# Patient Record
Sex: Male | Born: 1960
Health system: Southern US, Community
[De-identification: ages and names within clinical notes are randomized; demographics above are authoritative.]

## PROBLEM LIST (undated history)

## (undated) DIAGNOSIS — I10 Essential (primary) hypertension: Secondary | ICD-10-CM

## (undated) DIAGNOSIS — F329 Major depressive disorder, single episode, unspecified: Secondary | ICD-10-CM

## (undated) DIAGNOSIS — F1010 Alcohol abuse, uncomplicated: Secondary | ICD-10-CM

## (undated) DIAGNOSIS — K219 Gastro-esophageal reflux disease without esophagitis: Secondary | ICD-10-CM

## (undated) DIAGNOSIS — F32A Depression, unspecified: Secondary | ICD-10-CM

## (undated) HISTORY — DX: Gastro-esophageal reflux disease without esophagitis: K21.9

## (undated) HISTORY — DX: Depression, unspecified: F32.A

## (undated) HISTORY — DX: Major depressive disorder, single episode, unspecified: F32.9

## (undated) HISTORY — DX: Alcohol abuse, uncomplicated: F10.10

## (undated) HISTORY — DX: Essential (primary) hypertension: I10

---

## 1988-03-21 HISTORY — PX: MIDDLE EAR SURGERY: SHX713

## 1997-07-23 ENCOUNTER — Ambulatory Visit (HOSPITAL_BASED_OUTPATIENT_CLINIC_OR_DEPARTMENT_OTHER): Admission: RE | Admit: 1997-07-23 | Discharge: 1997-07-23 | Payer: Self-pay | Admitting: *Deleted

## 1999-06-14 ENCOUNTER — Encounter (INDEPENDENT_AMBULATORY_CARE_PROVIDER_SITE_OTHER): Payer: Self-pay

## 1999-06-14 ENCOUNTER — Encounter: Payer: Self-pay | Admitting: Urology

## 1999-06-14 ENCOUNTER — Ambulatory Visit (HOSPITAL_COMMUNITY): Admission: RE | Admit: 1999-06-14 | Discharge: 1999-06-14 | Payer: Self-pay | Admitting: Urology

## 2000-06-24 ENCOUNTER — Emergency Department (HOSPITAL_COMMUNITY): Admission: EM | Admit: 2000-06-24 | Discharge: 2000-06-24 | Payer: Self-pay | Admitting: Emergency Medicine

## 2001-12-04 ENCOUNTER — Ambulatory Visit (HOSPITAL_BASED_OUTPATIENT_CLINIC_OR_DEPARTMENT_OTHER): Admission: RE | Admit: 2001-12-04 | Discharge: 2001-12-04 | Payer: Self-pay | Admitting: Orthopedic Surgery

## 2001-12-04 ENCOUNTER — Encounter: Payer: Self-pay | Admitting: Orthopedic Surgery

## 2002-04-07 ENCOUNTER — Emergency Department (HOSPITAL_COMMUNITY): Admission: EM | Admit: 2002-04-07 | Discharge: 2002-04-07 | Payer: Self-pay | Admitting: Emergency Medicine

## 2002-05-15 ENCOUNTER — Emergency Department (HOSPITAL_COMMUNITY): Admission: EM | Admit: 2002-05-15 | Discharge: 2002-05-15 | Payer: Self-pay

## 2002-11-07 ENCOUNTER — Emergency Department (HOSPITAL_COMMUNITY): Admission: AD | Admit: 2002-11-07 | Discharge: 2002-11-07 | Payer: Self-pay | Admitting: Emergency Medicine

## 2004-07-21 ENCOUNTER — Ambulatory Visit: Payer: Self-pay | Admitting: *Deleted

## 2004-07-21 ENCOUNTER — Ambulatory Visit: Payer: Self-pay | Admitting: Nurse Practitioner

## 2004-08-05 ENCOUNTER — Ambulatory Visit: Payer: Self-pay | Admitting: Nurse Practitioner

## 2004-10-08 ENCOUNTER — Ambulatory Visit: Payer: Self-pay | Admitting: Nurse Practitioner

## 2004-10-15 ENCOUNTER — Ambulatory Visit: Payer: Self-pay | Admitting: Nurse Practitioner

## 2005-01-21 ENCOUNTER — Ambulatory Visit: Payer: Self-pay | Admitting: Nurse Practitioner

## 2005-05-11 ENCOUNTER — Ambulatory Visit: Payer: Self-pay | Admitting: Nurse Practitioner

## 2005-06-02 ENCOUNTER — Ambulatory Visit: Payer: Self-pay | Admitting: Nurse Practitioner

## 2005-07-15 ENCOUNTER — Ambulatory Visit: Payer: Self-pay | Admitting: Nurse Practitioner

## 2005-08-22 ENCOUNTER — Ambulatory Visit: Payer: Self-pay | Admitting: Nurse Practitioner

## 2005-10-18 ENCOUNTER — Ambulatory Visit: Payer: Self-pay | Admitting: Nurse Practitioner

## 2005-12-06 ENCOUNTER — Ambulatory Visit: Payer: Self-pay | Admitting: Nurse Practitioner

## 2006-01-02 ENCOUNTER — Ambulatory Visit: Payer: Self-pay | Admitting: Nurse Practitioner

## 2006-01-02 ENCOUNTER — Emergency Department (HOSPITAL_COMMUNITY): Admission: EM | Admit: 2006-01-02 | Discharge: 2006-01-02 | Payer: Self-pay | Admitting: Emergency Medicine

## 2006-02-24 ENCOUNTER — Ambulatory Visit: Payer: Self-pay | Admitting: Nurse Practitioner

## 2006-09-04 ENCOUNTER — Ambulatory Visit (HOSPITAL_COMMUNITY): Admission: RE | Admit: 2006-09-04 | Discharge: 2006-09-04 | Payer: Self-pay | Admitting: Internal Medicine

## 2006-09-04 ENCOUNTER — Ambulatory Visit: Payer: Self-pay | Admitting: Internal Medicine

## 2006-09-10 ENCOUNTER — Emergency Department (HOSPITAL_COMMUNITY): Admission: EM | Admit: 2006-09-10 | Discharge: 2006-09-10 | Payer: Self-pay | Admitting: Emergency Medicine

## 2006-10-09 DIAGNOSIS — F22 Delusional disorders: Secondary | ICD-10-CM | POA: Insufficient documentation

## 2006-10-09 DIAGNOSIS — E785 Hyperlipidemia, unspecified: Secondary | ICD-10-CM

## 2006-10-09 DIAGNOSIS — F3130 Bipolar disorder, current episode depressed, mild or moderate severity, unspecified: Secondary | ICD-10-CM

## 2006-10-09 DIAGNOSIS — I10 Essential (primary) hypertension: Secondary | ICD-10-CM

## 2006-10-09 DIAGNOSIS — F4310 Post-traumatic stress disorder, unspecified: Secondary | ICD-10-CM | POA: Insufficient documentation

## 2006-12-06 ENCOUNTER — Encounter (INDEPENDENT_AMBULATORY_CARE_PROVIDER_SITE_OTHER): Payer: Self-pay | Admitting: *Deleted

## 2010-08-06 NOTE — Op Note (Signed)
NAME:  Gabriel Miller, Gabriel Miller                            ACCOUNT NO.:  [id number]   MEDICAL RECORD NO.:  [id number]                   PATIENT TYPE:  AMB   LOCATION:  DSC                                  FACILITY:  [facility]   PHYSICIAN:  Katy Fitch. Naaman Plummer., M.D.          DATE OF BIRTH:  1960-11-11   DATE OF PROCEDURE:  12/04/2001  DATE OF DISCHARGE:                                 OPERATIVE REPORT   PREOPERATIVE DIAGNOSIS:  Extremely untidy ring avulsion amputation of left  ring finger with skin avulsion at level of diaphysis of proximal phalanx and  bone avulsion through diaphysis of middle phalanx.   POSTOPERATIVE DIAGNOSIS:  Extremely untidy ring avulsion amputation of left  ring finger with skin avulsion at level of diaphysis of proximal phalanx and  bone avulsion through diaphysis of middle phalanx.   OPERATIONS:  1. Extensive debridement of skin, subcutaneous tissue, tendon and     neurovascular structures due to severely soiled ring avulsion amputation,     left ring finger.  2. Revision amputation at diaphyseal level of proximal phalanx with a     partial primary closure of wound.   OPERATING SURGEON:  Katy Fitch. Sypher, M.D.   ASSISTANT:  Jonni Sanger, P.A.   ANESTHESIA:  Axillary block.   SUPERVISING ANESTHESIOLOGIST:  Janetta Hora. Gelene Mink, M.D.   INDICATIONS:  The patient is a 50 year old man who, earlier today, got his  left ring finger caught in the mechanism of a dump truck.  He sustained a  complex avulsion of his left ring finger with an osseous fracture amputation  through the middle phalanx and a skin avulsion at the web space adjacent to  the long and small fingers.  His wound was profoundly soiled with clay and  other debris.   He was brought by EMS to the emergency room where he was evaluated by Dr.  Beverely Pace.   Screening labs were obtained which revealed a hemoglobin of 14.7 grams  percent.  His coagulation studies were normal and his electrolytes were  noted to be normal except for an elevated glucose of 121.  This is a random  glucose.   A hand surgery consult was requested.  At the time of his evaluation, he was  noted to have a very untidy wound.  Arrangements were made for admission to  the hospital for revision amputation.  Preoperatively we had a lengthy  discussion with the patient and his family, recommending that we debride his  wound at this time and once a stable wound is obtained, we anticipate  recommending a ray amputation with transfer of the small finger metacarpal  towards the long finger metacarpal to close the hole in his hand.   Questions were invited and answered with the patient, his wife and son.   PROCEDURE:  The patient is brought to the operating room and placed in the  supine position on the operating  table.   An axillary block placed by Dr.  Gelene Mink in the holding area led to  satisfactory anesthesia of the left arm.  The arm was then prepped with  Betadine soap and solution and sterilely draped.   The procedure commenced with exsanguination of the limb with an Esmarch  bandage and placement of an arterial tourniquet to 270 mmHg due to mild  systolic hypertension.   The procedure commenced with an orderly debridement beginning with a sterile  sponge and Betadine and sterile saline with thoroughly scrubbing to remove  clay that was ground into the soft tissues.   Once the wound was thoroughly lavaged with about one liter of saline, a  sharp debridement of skin, subcutaneous tissue, devitalized tendon and  devitalized neurovascular structures was accomplished.   An orderly revision amputation was then accomplished at the level of the  diaphysis of the proximal phalanx with traction being placed on the flexor  tendons and transection of the superficialis profundus tendons followed by  traction being placed on the neurovascular structures with the nerves being  sharply transected with a fresh scalpel  blade and the arteries being  electrocauterized with the bipolar cautery.  The skin was sharply debrided  circumferentially.  A long palmar flap was created which was used to close  at the level of the web space with a total of three mattressed sutures in  the central portion of the wound.  The radial and ulnar aspects of the wound  were left open to allow egress of hematoma and other seropurulent fluids.   This wound was very untidy, extremely soiled and will be allowed to close by  secondary intension on both sides.   The tourniquet was released and hemostasis was noted to be satisfactory.  A  voluminous gauze dressing was applied with Ace wrap.   There were no apparent complications.   The patient tolerated the surgery and anesthesia well.  He was transferred  to the recovery room with stable vital signs.   He will be discharged with a prescription for Dilaudid 2 mg one or two  tablets p.o. q.4-6h. p.r.n. pain, 30 tablets, without refill.  Also Keflex  500 mg one p.o. q.8h. times four days for prophylactic antibiotic and Motrin  600 mg one p.o. q.6h. p.r.n. pain, 30 tablets without refill.                                               Katy Fitch Naaman Plummer., M.D.    RVS/MEDQ  D:  12/04/2001  T:  12/05/2001  Job:  [id number]   cc:   Katy Fitch. Naaman Plummer., M.D.

## 2010-11-26 ENCOUNTER — Emergency Department (HOSPITAL_COMMUNITY): Payer: Medicare Other

## 2010-11-26 ENCOUNTER — Emergency Department (HOSPITAL_COMMUNITY)
Admission: EM | Admit: 2010-11-26 | Discharge: 2010-11-26 | Disposition: A | Discharge status: Home / Self Care | Payer: Medicare Other | Attending: Emergency Medicine | Admitting: Emergency Medicine

## 2010-11-26 DIAGNOSIS — F319 Bipolar disorder, unspecified: Secondary | ICD-10-CM | POA: Insufficient documentation

## 2010-11-26 DIAGNOSIS — E785 Hyperlipidemia, unspecified: Secondary | ICD-10-CM | POA: Insufficient documentation

## 2010-11-26 DIAGNOSIS — S0510XA Contusion of eyeball and orbital tissues, unspecified eye, initial encounter: Secondary | ICD-10-CM | POA: Insufficient documentation

## 2010-11-26 DIAGNOSIS — S0280XA Fracture of other specified skull and facial bones, unspecified side, initial encounter for closed fracture: Secondary | ICD-10-CM | POA: Insufficient documentation

## 2010-11-26 DIAGNOSIS — R220 Localized swelling, mass and lump, head: Secondary | ICD-10-CM | POA: Insufficient documentation

## 2010-11-26 DIAGNOSIS — H1130 Conjunctival hemorrhage, unspecified eye: Secondary | ICD-10-CM | POA: Insufficient documentation

## 2010-11-26 DIAGNOSIS — F17200 Nicotine dependence, unspecified, uncomplicated: Secondary | ICD-10-CM | POA: Insufficient documentation

## 2010-11-26 DIAGNOSIS — Z79899 Other long term (current) drug therapy: Secondary | ICD-10-CM | POA: Insufficient documentation

## 2010-11-26 DIAGNOSIS — I10 Essential (primary) hypertension: Secondary | ICD-10-CM | POA: Insufficient documentation

## 2010-11-26 DIAGNOSIS — R51 Headache: Secondary | ICD-10-CM | POA: Insufficient documentation

## 2010-11-26 DIAGNOSIS — R221 Localized swelling, mass and lump, neck: Secondary | ICD-10-CM | POA: Insufficient documentation

## 2010-11-26 DIAGNOSIS — R079 Chest pain, unspecified: Secondary | ICD-10-CM | POA: Insufficient documentation

## 2010-11-26 DIAGNOSIS — S0230XA Fracture of orbital floor, unspecified side, initial encounter for closed fracture: Secondary | ICD-10-CM | POA: Insufficient documentation

## 2011-01-05 LAB — CBC
HCT: 44.2
Hemoglobin: 14.7
MCHC: 33.3
MCV: 80.7
Platelets: 274
RBC: 5.47
RDW: 13.7
WBC: 6.8

## 2011-01-05 LAB — D-DIMER, QUANTITATIVE: D-Dimer, Quant: <0.22

## 2011-01-05 LAB — DIFFERENTIAL
Basophils Absolute: 0
Basophils Relative: 1
Eosinophils Absolute: 0.7
Eosinophils Relative: 10 — ABNORMAL HIGH
Lymphocytes Relative: 25
Lymphs Abs: 1.7
Monocytes Absolute: 0.7
Monocytes Relative: 11
Neutro Abs: 3.6
Neutrophils Relative %: 54

## 2011-01-05 LAB — BASIC METABOLIC PANEL WITH GFR
BUN: 13
CO2: 25
Calcium: 9
Creatinine, Ser: 1.56 — ABNORMAL HIGH
GFR calc Af Amer: 59 — ABNORMAL LOW
Glucose, Bld: 107 — ABNORMAL HIGH

## 2011-01-05 LAB — BASIC METABOLIC PANEL
Chloride: 102
GFR calc non Af Amer: 48 — ABNORMAL LOW
Potassium: 3.8
Sodium: 135

## 2011-01-05 LAB — POCT CARDIAC MARKERS
CKMB, poc: <1 — ABNORMAL LOW
Troponin i, poc: <0.05

## 2011-01-27 ENCOUNTER — Encounter: Payer: Self-pay | Admitting: Family Medicine

## 2011-01-27 ENCOUNTER — Ambulatory Visit (INDEPENDENT_AMBULATORY_CARE_PROVIDER_SITE_OTHER): Payer: Medicare Other | Admitting: Family Medicine

## 2011-01-27 VITALS — BP 118/88 | HR 80 | Temp 98.5°F | Resp 12 | Ht 69.0 in | Wt 204.0 lb

## 2011-01-27 DIAGNOSIS — Z23 Encounter for immunization: Secondary | ICD-10-CM

## 2011-01-27 DIAGNOSIS — F4310 Post-traumatic stress disorder, unspecified: Secondary | ICD-10-CM

## 2011-01-27 DIAGNOSIS — F3130 Bipolar disorder, current episode depressed, mild or moderate severity, unspecified: Secondary | ICD-10-CM

## 2011-01-27 DIAGNOSIS — I10 Essential (primary) hypertension: Secondary | ICD-10-CM

## 2011-01-27 NOTE — Patient Instructions (Signed)
Consider complete physical at some point early next year.
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
## 2011-05-20 ENCOUNTER — Other Ambulatory Visit: Payer: Medicare Other

## 2011-05-27 ENCOUNTER — Encounter: Payer: Medicare Other | Admitting: Family Medicine

## 2011-09-04 ENCOUNTER — Encounter (HOSPITAL_COMMUNITY): Payer: Self-pay | Admitting: *Deleted

## 2011-09-04 ENCOUNTER — Inpatient Hospital Stay (HOSPITAL_COMMUNITY)
Admission: EM | Admit: 2011-09-04 | Discharge: 2011-09-07 | DRG: 189 | Disposition: A | Discharge status: Home / Self Care | Payer: Medicare Other | Source: Ambulatory Visit | Attending: Internal Medicine | Admitting: Internal Medicine

## 2011-09-04 ENCOUNTER — Emergency Department (HOSPITAL_COMMUNITY): Payer: Medicare Other

## 2011-09-04 DIAGNOSIS — E871 Hypo-osmolality and hyponatremia: Secondary | ICD-10-CM

## 2011-09-04 DIAGNOSIS — I10 Essential (primary) hypertension: Secondary | ICD-10-CM

## 2011-09-04 DIAGNOSIS — F3130 Bipolar disorder, current episode depressed, mild or moderate severity, unspecified: Secondary | ICD-10-CM | POA: Diagnosis present

## 2011-09-04 DIAGNOSIS — J40 Bronchitis, not specified as acute or chronic: Secondary | ICD-10-CM | POA: Diagnosis present

## 2011-09-04 DIAGNOSIS — R55 Syncope and collapse: Secondary | ICD-10-CM | POA: Diagnosis present

## 2011-09-04 DIAGNOSIS — F4310 Post-traumatic stress disorder, unspecified: Secondary | ICD-10-CM

## 2011-09-04 DIAGNOSIS — I129 Hypertensive chronic kidney disease with stage 1 through stage 4 chronic kidney disease, or unspecified chronic kidney disease: Secondary | ICD-10-CM | POA: Diagnosis present

## 2011-09-04 DIAGNOSIS — R0902 Hypoxemia: Secondary | ICD-10-CM

## 2011-09-04 DIAGNOSIS — F17200 Nicotine dependence, unspecified, uncomplicated: Secondary | ICD-10-CM | POA: Diagnosis present

## 2011-09-04 DIAGNOSIS — F22 Delusional disorders: Secondary | ICD-10-CM

## 2011-09-04 DIAGNOSIS — J9600 Acute respiratory failure, unspecified whether with hypoxia or hypercapnia: Secondary | ICD-10-CM

## 2011-09-04 DIAGNOSIS — J441 Chronic obstructive pulmonary disease with (acute) exacerbation: Secondary | ICD-10-CM | POA: Diagnosis present

## 2011-09-04 DIAGNOSIS — E876 Hypokalemia: Secondary | ICD-10-CM | POA: Diagnosis present

## 2011-09-04 DIAGNOSIS — J9601 Acute respiratory failure with hypoxia: Secondary | ICD-10-CM

## 2011-09-04 DIAGNOSIS — E785 Hyperlipidemia, unspecified: Secondary | ICD-10-CM | POA: Diagnosis present

## 2011-09-04 DIAGNOSIS — N182 Chronic kidney disease, stage 2 (mild): Secondary | ICD-10-CM | POA: Diagnosis present

## 2011-09-04 DIAGNOSIS — R05 Cough: Secondary | ICD-10-CM | POA: Diagnosis present

## 2011-09-04 DIAGNOSIS — Z8249 Family history of ischemic heart disease and other diseases of the circulatory system: Secondary | ICD-10-CM

## 2011-09-04 LAB — CBC
Hemoglobin: 14.8 g/dL (ref 13.0–17.0)
MCHC: 34.3 g/dL (ref 30.0–36.0)
RBC: 4.71 MIL/uL (ref 4.22–5.81)
WBC: 9.4 10*3/uL (ref 4.0–10.5)

## 2011-09-04 LAB — CARDIAC PANEL(CRET KIN+CKTOT+MB+TROPI)
CK, MB: 1.9 ng/mL (ref 0.3–4.0)
Troponin I: <0.3 ng/mL (ref <0.30)

## 2011-09-04 LAB — BASIC METABOLIC PANEL
BUN: 15 mg/dL (ref 6–23)
Chloride: 100 mEq/L (ref 96–112)
GFR calc Af Amer: 70 mL/min — ABNORMAL LOW (ref >90)
Potassium: 3.2 mEq/L — ABNORMAL LOW (ref 3.5–5.1)

## 2011-09-04 LAB — DIFFERENTIAL
Basophils Relative: 0 % (ref 0–1)
Lymphs Abs: 1.4 10*3/uL (ref 0.7–4.0)
Monocytes Relative: 7 % (ref 3–12)
Neutro Abs: 7 10*3/uL (ref 1.7–7.7)
Neutrophils Relative %: 75 % (ref 43–77)

## 2011-09-04 MED ORDER — GABAPENTIN 400 MG PO CAPS
1200.0000 mg | ORAL_CAPSULE | Freq: Every day | ORAL | Status: DC
  Administered 2011-09-04 – 2011-09-06 (×3): 1200 mg via ORAL
  Filled 2011-09-04 (×4): qty 3

## 2011-09-04 MED ORDER — IPRATROPIUM BROMIDE 0.02 % IN SOLN
0.5000 mg | Freq: Once | RESPIRATORY_TRACT | Status: AC
  Administered 2011-09-04: 0.5 mg via RESPIRATORY_TRACT
  Filled 2011-09-04: qty 2.5

## 2011-09-04 MED ORDER — ACETAMINOPHEN 650 MG RE SUPP: 650.0000 mg | Freq: Four times a day (QID) | RECTAL | Status: DC | PRN

## 2011-09-04 MED ORDER — ALBUTEROL SULFATE (5 MG/ML) 0.5% IN NEBU: 5.0000 mg | INHALATION_SOLUTION | RESPIRATORY_TRACT | Status: AC | PRN

## 2011-09-04 MED ORDER — AZITHROMYCIN 500 MG PO TABS
500.0000 mg | ORAL_TABLET | Freq: Every day | ORAL | Status: DC
  Administered 2011-09-04: 500 mg via ORAL
  Filled 2011-09-04: qty 1
  Filled 2011-09-04: qty 2

## 2011-09-04 MED ORDER — SODIUM CHLORIDE 0.9 % IV SOLN
INTRAVENOUS | Status: DC
  Administered 2011-09-04: 20:00:00 via INTRAVENOUS

## 2011-09-04 MED ORDER — GUAIFENESIN ER 600 MG PO TB12
600.0000 mg | ORAL_TABLET | Freq: Two times a day (BID) | ORAL | Status: DC
  Administered 2011-09-04 – 2011-09-07 (×6): 600 mg via ORAL
  Filled 2011-09-04 (×7): qty 1

## 2011-09-04 MED ORDER — ONDANSETRON HCL 4 MG PO TABS: 4.0000 mg | ORAL_TABLET | Freq: Four times a day (QID) | ORAL | Status: DC | PRN

## 2011-09-04 MED ORDER — VITAMIN B-1 100 MG PO TABS
100.0000 mg | ORAL_TABLET | Freq: Every day | ORAL | Status: DC
  Administered 2011-09-04 – 2011-09-07 (×4): 100 mg via ORAL
  Filled 2011-09-04 (×4): qty 1

## 2011-09-04 MED ORDER — TRAZODONE HCL 50 MG PO TABS
50.0000 mg | ORAL_TABLET | Freq: Every evening | ORAL | Status: DC | PRN
  Administered 2011-09-05: 50 mg via ORAL
  Filled 2011-09-04: qty 1

## 2011-09-04 MED ORDER — FOLIC ACID 1 MG PO TABS
1.0000 mg | ORAL_TABLET | Freq: Every day | ORAL | Status: DC
  Administered 2011-09-04 – 2011-09-07 (×4): 1 mg via ORAL
  Filled 2011-09-04 (×4): qty 1

## 2011-09-04 MED ORDER — NICOTINE 14 MG/24HR TD PT24
14.0000 mg | MEDICATED_PATCH | Freq: Every day | TRANSDERMAL | Status: DC
  Administered 2011-09-04 – 2011-09-07 (×4): 14 mg via TRANSDERMAL
  Filled 2011-09-04 (×4): qty 1

## 2011-09-04 MED ORDER — IPRATROPIUM BROMIDE 0.02 % IN SOLN
0.5000 mg | Freq: Four times a day (QID) | RESPIRATORY_TRACT | Status: DC
  Administered 2011-09-04 – 2011-09-07 (×11): 0.5 mg via RESPIRATORY_TRACT
  Filled 2011-09-04 (×11): qty 2.5

## 2011-09-04 MED ORDER — HALOPERIDOL 5 MG PO TABS
5.0000 mg | ORAL_TABLET | Freq: Every evening | ORAL | Status: DC | PRN
  Administered 2011-09-06: 5 mg via ORAL
  Filled 2011-09-04 (×2): qty 1

## 2011-09-04 MED ORDER — POTASSIUM CHLORIDE CRYS ER 20 MEQ PO TBCR
40.0000 meq | EXTENDED_RELEASE_TABLET | Freq: Once | ORAL | Status: AC
  Administered 2011-09-04: 40 meq via ORAL
  Filled 2011-09-04: qty 2

## 2011-09-04 MED ORDER — GABAPENTIN 400 MG PO CAPS
400.0000 mg | ORAL_CAPSULE | Freq: Three times a day (TID) | ORAL | Status: DC
  Filled 2011-09-04: qty 3

## 2011-09-04 MED ORDER — GUAIFENESIN-DM 100-10 MG/5ML PO SYRP
5.0000 mL | ORAL_SOLUTION | ORAL | Status: DC | PRN
  Administered 2011-09-04 – 2011-09-07 (×4): 5 mL via ORAL
  Filled 2011-09-04 (×6): qty 5

## 2011-09-04 MED ORDER — ENOXAPARIN SODIUM 40 MG/0.4ML ~~LOC~~ SOLN
40.0000 mg | SUBCUTANEOUS | Status: DC
  Administered 2011-09-04 – 2011-09-06 (×3): 40 mg via SUBCUTANEOUS
  Filled 2011-09-04 (×4): qty 0.4

## 2011-09-04 MED ORDER — ACETAMINOPHEN 325 MG PO TABS
650.0000 mg | ORAL_TABLET | Freq: Four times a day (QID) | ORAL | Status: DC | PRN
  Administered 2011-09-06: 650 mg via ORAL
  Filled 2011-09-04: qty 2

## 2011-09-04 MED ORDER — ALBUTEROL SULFATE (5 MG/ML) 0.5% IN NEBU
5.0000 mg | INHALATION_SOLUTION | Freq: Once | RESPIRATORY_TRACT | Status: AC
  Administered 2011-09-04: 5 mg via RESPIRATORY_TRACT
  Filled 2011-09-04: qty 1

## 2011-09-04 MED ORDER — GABAPENTIN 400 MG PO CAPS
400.0000 mg | ORAL_CAPSULE | Freq: Two times a day (BID) | ORAL | Status: DC
  Administered 2011-09-05 – 2011-09-07 (×5): 400 mg via ORAL
  Filled 2011-09-04 (×6): qty 1

## 2011-09-04 MED ORDER — ALBUTEROL SULFATE (5 MG/ML) 0.5% IN NEBU: 2.5000 mg | INHALATION_SOLUTION | RESPIRATORY_TRACT | Status: DC | PRN

## 2011-09-04 MED ORDER — ONDANSETRON HCL 4 MG/2ML IJ SOLN: 4.0000 mg | Freq: Four times a day (QID) | INTRAMUSCULAR | Status: DC | PRN

## 2011-09-04 MED ORDER — ZOLPIDEM TARTRATE 5 MG PO TABS
5.0000 mg | ORAL_TABLET | Freq: Every evening | ORAL | Status: DC | PRN
  Administered 2011-09-04: 5 mg via ORAL
  Filled 2011-09-04: qty 1

## 2011-09-04 MED ORDER — METHYLPREDNISOLONE SODIUM SUCC 40 MG IJ SOLR
40.0000 mg | Freq: Four times a day (QID) | INTRAMUSCULAR | Status: DC
  Administered 2011-09-04 – 2011-09-05 (×3): 40 mg via INTRAVENOUS
  Filled 2011-09-04 (×7): qty 1

## 2011-09-04 MED ORDER — CEFTRIAXONE SODIUM 1 G IJ SOLR
1.0000 g | INTRAMUSCULAR | Status: DC
  Administered 2011-09-04: 1 g via INTRAVENOUS
  Filled 2011-09-04 (×2): qty 10

## 2011-09-04 MED ORDER — ALBUTEROL SULFATE (5 MG/ML) 0.5% IN NEBU
2.5000 mg | INHALATION_SOLUTION | Freq: Four times a day (QID) | RESPIRATORY_TRACT | Status: DC
  Administered 2011-09-04 – 2011-09-07 (×11): 2.5 mg via RESPIRATORY_TRACT
  Filled 2011-09-04 (×11): qty 0.5

## 2011-09-04 MED ORDER — FLUOXETINE HCL 20 MG PO CAPS
60.0000 mg | ORAL_CAPSULE | Freq: Every morning | ORAL | Status: DC
  Administered 2011-09-05 – 2011-09-07 (×3): 60 mg via ORAL
  Filled 2011-09-04 (×3): qty 3

## 2011-09-04 NOTE — ED Provider Notes (Signed)
History     CSN: [id number]  Arrival date & time 09/04/11  1105   First MD Initiated Contact with Patient 09/04/11 1227      Chief Complaint  Patient presents with  . Shortness of Breath  . Cough  . Dizziness    (Consider location/radiation/quality/duration/timing/severity/associated sxs/prior treatment) Patient is a 51 y.o. male presenting with shortness of breath and cough. The history is provided by the patient.  Shortness of Breath  The current episode started 2 days ago. Associated symptoms include a fever, cough, shortness of breath and wheezing. Pertinent negatives include no chest pain.  Cough Associated symptoms include shortness of breath and wheezing. Pertinent negatives include no chest pain and no headaches.   and patient's had nausea the patient had cough is productive since Friday. Some shortness breath and dizziness. He is a smoker but does not have a clear diagnosis of COPD. He still feels weak all over. No nausea vomiting diarrhea. No chest pain. No leg swelling. No recent travel.  Past Medical History  Diagnosis Date  . Alcohol abuse   . Depression   . GERD (gastroesophageal reflux disease)   . Hypertension     Past Surgical History  Procedure Date  . Middle ear surgery 1990    Family History  Problem Relation Age of Onset  . Arthritis Mother   . Heart disease Mother   . Hypertension Mother   . Diabetes Mother   . COPD Mother   . Alcohol abuse Father   . Hypertension Father   . Diabetes Father   . Heart attack Father   . Hypertension Maternal Aunt   . Cancer Maternal Aunt     breast  . Cancer Maternal Grandmother     breast  . Diabetes Maternal Grandmother   . Diabetes Maternal Grandfather   . Diabetes Paternal Grandfather     History  Substance Use Topics  . Smoking status: Current Everyday Smoker -- 11.0 packs/day for 20 years    Types: Cigarettes  . Smokeless tobacco: Not on file  . Alcohol Use: Yes      Review of Systems    Constitutional: Positive for fever and fatigue. Negative for activity change and appetite change.  HENT: Negative for neck stiffness.   Eyes: Negative for pain.  Respiratory: Positive for cough, shortness of breath and wheezing. Negative for chest tightness.   Cardiovascular: Negative for chest pain and leg swelling.  Gastrointestinal: Negative for nausea, vomiting, abdominal pain and diarrhea.  Genitourinary: Negative for flank pain.  Musculoskeletal: Negative for back pain.  Skin: Negative for rash.  Neurological: Negative for weakness, numbness and headaches.  Psychiatric/Behavioral: Negative for behavioral problems.    Allergies  Review of patient's allergies indicates no known allergies.  Home Medications   Current Outpatient Rx  Name Route Sig Dispense Refill  . FLUOXETINE HCL 20 MG PO CAPS Oral Take 60 mg by mouth every morning.     Marland Kitchen GABAPENTIN 400 MG PO CAPS Oral Take 400-1,200 mg by mouth 3 (three) times daily. Take 400 MG twice a day and take 1200 MG at bedtime.    Marland Kitchen HALOPERIDOL 5 MG PO TABS Oral Take 5 mg by mouth at bedtime.     . TRAZODONE HCL 50 MG PO TABS Oral Take 50 mg by mouth at bedtime as needed. For sleep.      BP 139/91  Pulse 93  Temp 97.3 F (36.3 C) (Oral)  Resp 20  SpO2 90%  Physical Exam  Nursing  note and vitals reviewed. Constitutional: He is oriented to person, place, and time. He appears well-developed and well-nourished.  HENT:  Head: Normocephalic and atraumatic.  Eyes: EOM are normal. Pupils are equal, round, and reactive to light.  Neck: Normal range of motion. Neck supple.  Cardiovascular: Normal rate, regular rhythm and normal heart sounds.   No murmur heard. Pulmonary/Chest:       Diffuse harsh breath sounds and prolonged expirations. Diffuse wheezes. Mild respiratory distress.  Abdominal: Soft. Bowel sounds are normal. He exhibits no distension and no mass. There is no tenderness. There is no rebound and no guarding.   Musculoskeletal: Normal range of motion. He exhibits no edema.  Neurological: He is alert and oriented to person, place, and time. No cranial nerve deficit.  Skin: Skin is warm and dry.  Psychiatric: He has a normal mood and affect.    ED Course  Procedures (including critical care time)  Labs Reviewed  BASIC METABOLIC PANEL - Abnormal; Notable for the following:    Potassium 3.2 (*)     Glucose, Bld 110 (*)     GFR calc non Af Amer 60 (*)     GFR calc Af Amer 70 (*)     All other components within normal limits  CBC  DIFFERENTIAL  PRO B NATRIURETIC PEPTIDE  TROPONIN I  CARDIAC PANEL(CRET KIN+CKTOT+MB+TROPI)  CARDIAC PANEL(CRET KIN+CKTOT+MB+TROPI)   Dg Chest 2 View  09/04/2011  *RADIOLOGY REPORT*  Clinical Data: Shortness of breath, cough and dizziness.  CHEST - 2 VIEW  Comparison: Chest x-ray 11/26/2010.  Findings: There is mild diffuse interstitial prominence and peribronchial cuffing.  No acute consolidative airspace disease. No pleural effusions.  Pulmonary vasculature and the cardiomediastinal silhouette are within normal limits.  IMPRESSION: 1.  Mild diffuse interstitial prominence and peribronchial cuffing. Findings can be seen in the setting of acute bronchitis, or less likely reactive airway disease (no associated hyperexpansion on today's study).  Clinical correlation is recommended.  Original Report Authenticated By: Florencia Reasons, M.D.     1. Bronchitis   2. Hypoxia     Date: 09/04/2011  Rate: 93  Rhythm: normal sinus rhythm  QRS Axis: normal  Intervals: normal  ST/T Wave abnormalities: normal  Conduction Disutrbances:none  Narrative Interpretation:   Old EKG Reviewed: unchanged     MDM  Shortness of breath. Bronchitis on x-ray. Patient stats will get to the 80s on his nasal cannula with any exertion. Patient feels somewhat better after breathing treatments. Likely COPD at baseline. He'll be admitted.        Juliet Rude. Rubin Payor, MD 09/04/11  1657

## 2011-09-04 NOTE — H&P (Signed)
Patient's PCP: Kristian Covey, MD  Chief Complaint: Cough and shortness of breath  History of Present Illness: Gabriel Miller is a 51 y.o. Caucasian male with history of hypertension, depression, GERD, tobacco and alcohol use who presents with the above complaints.  He notes that his symptoms started on 09/01/2011 with cough, which since then has progressed.  He is coughing up phlegm.  Yesterday he noted that he coughs so much that he lost consciousness for a second before he regained consciousness.  He denies hitting his head.  Given his worsening cough or shortness of breath he presented to the emergency department for further evaluation.  In the emergency department per nursing on room air patient's saturation dropped down to 88%.  He was wheezy on exam.  Despite neb treatments his symptoms did not improve as a result the hospitalist service was asked to admit the patient for further care and management.  He denies any recent fevers, chills, chest pain, abdominal pain, diarrhea, headaches or vision changes.  Past Medical History  Diagnosis Date  . Alcohol abuse   . Depression   . GERD (gastroesophageal reflux disease)   . Hypertension    Past Surgical History  Procedure Date  . Middle ear surgery 1990   Family History  Problem Relation Age of Onset  . Arthritis Mother   . Heart disease Mother   . Hypertension Mother   . Diabetes Mother   . COPD Mother   . Alcohol abuse Father   . Hypertension Father   . Diabetes Father   . Heart attack Father   . Hypertension Maternal Aunt   . Cancer Maternal Aunt     breast  . Cancer Maternal Grandmother     breast  . Diabetes Maternal Grandmother   . Diabetes Maternal Grandfather   . Diabetes Paternal Grandfather    History   Social History  . Marital Status: Single    Spouse Name: N/A    Number of Children: N/A  . Years of Education: N/A   Occupational History  . Not on file.   Social History Main Topics  . Smoking status:  Current Everyday Smoker -- 11.0 packs/day for 20 years    Types: Cigarettes  . Smokeless tobacco: Not on file  . Alcohol Use: Yes     drinks half-gallon liquor once a week.  . Drug Use: Not on file  . Sexually Active: Not on file   Other Topics Concern  . Not on file   Social History Narrative  . No narrative on file   Allergies: Review of patient's allergies indicates no known allergies.  Meds: Scheduled Meds:   . albuterol  5 mg Nebulization Once  . azithromycin  500 mg Oral Daily  . cefTRIAXone (ROCEPHIN)  IV  1 g Intravenous Q24H  . ipratropium  0.5 mg Nebulization Once  . methylPREDNISolone (SOLU-MEDROL) injection  40 mg Intravenous Q6H   Continuous Infusions:  PRN Meds:.  Review of Systems: All systems reviewed with the patient and positive as per history of present illness, otherwise all other systems are negative.  Physical Exam: Blood pressure 139/91, pulse 93, temperature 97.3 F (36.3 C), temperature source Oral, resp. rate 20, SpO2 90.00%. General: Awake, Oriented x3, No acute distress. HEENT: EOMI, Moist mucous membranes Neck: Supple CV: S1 and S2 Lungs: Expiratory wheezing, moderate air movement. Abdomen: Soft, Nontender, Nondistended, +bowel sounds. Ext: Good pulses. Trace edema. No clubbing or cyanosis noted. Neuro: Cranial Nerves II-XII grossly intact. Has 5/5 motor strength  in upper and lower extremities.  Lab results:  [facility] 09/04/11 1247  NA 140  K 3.2*  CL 100  CO2 30  GLUCOSE 110*  BUN 15  CREATININE 1.34  CALCIUM 8.6  MG --  PHOS --   No results found for this basename: AST:2,ALT:2,ALKPHOS:2,BILITOT:2,PROT:2,ALBUMIN:2 in the last 72 hours No results found for this basename: LIPASE:2,AMYLASE:2 in the last 72 hours  Basename 09/04/11 1247  WBC 9.4  NEUTROABS 7.0  HGB 14.8  HCT 43.1  MCV 91.5  PLT 179    Basename 09/04/11 1247  CKTOTAL --  CKMB --  CKMBINDEX --  TROPONINI <0.30   No components found with this basename:  POCBNP:3 No results found for this basename: DDIMER in the last 72 hours No results found for this basename: HGBA1C:2 in the last 72 hours No results found for this basename: CHOL:2,HDL:2,LDLCALC:2,TRIG:2,CHOLHDL:2,LDLDIRECT:2 in the last 72 hours No results found for this basename: TSH,T4TOTAL,FREET3,T3FREE,THYROIDAB in the last 72 hours No results found for this basename: VITAMINB12:2,FOLATE:2,FERRITIN:2,TIBC:2,IRON:2,RETICCTPCT:2 in the last 72 hours Imaging results:  Dg Chest 2 View  09/04/2011  *RADIOLOGY REPORT*  Clinical Data: Shortness of breath, cough and dizziness.  CHEST - 2 VIEW  Comparison: Chest x-ray 11/26/2010.  Findings: There is mild diffuse interstitial prominence and peribronchial cuffing.  No acute consolidative airspace disease. No pleural effusions.  Pulmonary vasculature and the cardiomediastinal silhouette are within normal limits.  IMPRESSION: 1.  Mild diffuse interstitial prominence and peribronchial cuffing. Findings can be seen in the setting of acute bronchitis, or less likely reactive airway disease (no associated hyperexpansion on today's study).  Clinical correlation is recommended.  Original Report Authenticated By: Florencia Reasons, M.D.   Other results: EKG: there are no previous tracings available for comparison, normal sinus rhythm.  Assessment & Plan by Problem: Acute respiratory failure with hypoxia Due to reactive airway disease from bronchitis, patient does not have pulmonary function test confirmed diagnosis of COPD.  Start patient on steroids.  Continue neb treatments.  Start the patient on ceftriaxone and azithromycin for bronchitis.  Wean patient off oxygen as tolerated.  Reactive airway disease, presumed COPD Management as indicated above.  Once patient's respiratory status improves, patient will likely need outpatient pulmonary function tests.  Tobacco and alcohol use Counseled the patient on cessation of tobacco.  Nicotine patches ordered.   Monitor for withdrawal from alcohol.  Will have the patient on thiamine and folic acid.  Hypertension Not on antihypertensive medications.  Continue to monitor.  Syncope due to cough from bronchitis. Continue to monitor on telemetry.  EKG reviewed.  Trend troponins, initial troponin negative.  Hypokalemia Replace as needed.  Hyperlipidemia Stable.  Further management as outpatient.  Chronic kidney disease stage II Creatinine at baseline compared to renal function in 2008.  Depression Continue home medications.  Prophylaxis Lovenox.  CODE STATUS Full code.  Disposition Admit to telemetry.  Time spent on admission, talking to the patient, and coordinating care was: 60 mins.  Dshawn Mcnay A, MD 09/04/2011, 5:06 PM

## 2011-09-04 NOTE — ED Notes (Signed)
Pt reports since Friday productive cough, sob, and dizziness. Denies chest pain.

## 2011-09-05 DIAGNOSIS — J9600 Acute respiratory failure, unspecified whether with hypoxia or hypercapnia: Secondary | ICD-10-CM

## 2011-09-05 DIAGNOSIS — I10 Essential (primary) hypertension: Secondary | ICD-10-CM

## 2011-09-05 DIAGNOSIS — E871 Hypo-osmolality and hyponatremia: Secondary | ICD-10-CM

## 2011-09-05 DIAGNOSIS — F17200 Nicotine dependence, unspecified, uncomplicated: Secondary | ICD-10-CM

## 2011-09-05 LAB — BASIC METABOLIC PANEL
BUN: 15 mg/dL (ref 6–23)
Chloride: 104 mEq/L (ref 96–112)
GFR calc Af Amer: >90 mL/min (ref >90)
Glucose, Bld: 162 mg/dL — ABNORMAL HIGH (ref 70–99)
Potassium: 3.9 mEq/L (ref 3.5–5.1)

## 2011-09-05 LAB — CBC
HCT: 44 % (ref 39.0–52.0)
Hemoglobin: 15 g/dL (ref 13.0–17.0)
MCH: 30.8 pg (ref 26.0–34.0)
MCHC: 34.1 g/dL (ref 30.0–36.0)

## 2011-09-05 MED ORDER — AZITHROMYCIN 250 MG PO TABS
250.0000 mg | ORAL_TABLET | Freq: Every day | ORAL | Status: DC
  Administered 2011-09-05 – 2011-09-07 (×3): 250 mg via ORAL
  Filled 2011-09-05 (×3): qty 1

## 2011-09-05 MED ORDER — PREDNISONE 20 MG PO TABS
40.0000 mg | ORAL_TABLET | Freq: Every day | ORAL | Status: DC
  Administered 2011-09-05 – 2011-09-07 (×3): 40 mg via ORAL
  Filled 2011-09-05 (×4): qty 2

## 2011-09-05 MED ORDER — METHYLPREDNISOLONE SODIUM SUCC 40 MG IJ SOLR
40.0000 mg | Freq: Four times a day (QID) | INTRAMUSCULAR | Status: AC
  Administered 2011-09-05: 40 mg via INTRAVENOUS
  Filled 2011-09-05: qty 1

## 2011-09-05 MED ORDER — LEVOFLOXACIN 500 MG PO TABS
500.0000 mg | ORAL_TABLET | Freq: Every day | ORAL | Status: DC
  Filled 2011-09-05: qty 1

## 2011-09-05 MED ORDER — CEFUROXIME AXETIL 500 MG PO TABS
500.0000 mg | ORAL_TABLET | Freq: Two times a day (BID) | ORAL | Status: DC
  Administered 2011-09-05 – 2011-09-07 (×5): 500 mg via ORAL
  Filled 2011-09-05 (×7): qty 1

## 2011-09-05 NOTE — Progress Notes (Signed)
SATURATION QUALIFICATIONS:  Patient Saturations on Room Air at Rest = 88%    Madelin Rear RN, [facility]

## 2011-09-05 NOTE — Progress Notes (Signed)
Patient weaned down to 1 liter of oxygen via nasal canula. Madelin Rear RN, CMSRN

## 2011-09-05 NOTE — Care Management Note (Signed)
    Page 1 of 1   09/07/2011     11:23:08 AM   CARE MANAGEMENT NOTE 09/07/2011  Patient:  Gabriel Miller, Gabriel Miller   Account Number:  [id number]  Date Initiated:  09/05/2011  Documentation initiated by:  Letha Cape  Subjective/Objective Assessment:   dx acute resp failure with hypoxia  admit- lives with mother, brother and sister n law.  pta independent.     Action/Plan:   Anticipated DC Date:  09/07/2011   Anticipated DC Plan:  HOME/SELF CARE      DC Planning Services  CM consult      Choice offered to / List presented to:     DME arranged  NEBULIZER MACHINE      DME agency  [facility]        Status of service:  Completed, signed off Medicare Important Message given?   (If response is "NO", the following Medicare IM given date fields will be blank) Date Medicare IM given:   Date Additional Medicare IM given:    Discharge Disposition:  HOME/SELF CARE  Per UR Regulation:  Reviewed for med. necessity/level of care/duration of stay  If discussed at Long Length of Stay Meetings, dates discussed:    Comments:  09/07/11 Neb as soon as possibleulizer ordered for home, patient agreeable to receiving nebulizer from [facility]. Contacted Justine with [facility] and requested nebulizer to be delivered to patient's room as soon as possible. Informed patient nebulizer to be delivered to room prior to d/c. Informed RN that nebulizer to be delivered to room prior to d/c.  Jacquelynn Cree RN, BSN, CCM  09/05/11 2:39 Letha Cape RN, BSN [phone] patient lives with mother, brother and sister n law, pta independent.  Patient has medication coverge and transportation.   No needs anticipated.   NCM will continue to follow for dc needs.

## 2011-09-05 NOTE — Progress Notes (Addendum)
Subjective: Reports his breathing and cough is better today.  No other specific complaints.  Objective: Vital signs in last 24 hours: Filed Vitals:   09/04/11 2307 09/05/11 0155 09/05/11 0453 09/05/11 0737  BP: 130/79  134/76   Pulse: 93  71   Temp: 97.9 F (36.6 C)  98.2 F (36.8 C)   TempSrc: Oral  Oral   Resp: 18  18   Height:      Weight:      SpO2: 93% 96% 97% 92%   Weight change:   Intake/Output Summary (Last 24 hours) at 09/05/11 0932 Last data filed at 09/05/11 0811  Gross per 24 hour  Intake 1040.83 ml  Output    300 ml  Net 740.83 ml    Physical Exam: General: Awake, Oriented, No acute distress. HEENT: EOMI. Neck: Supple CV: S1 and S2 Lungs: Expiratory wheezing, improved from yesterday. Abdomen: Soft, Nontender, Nondistended, +bowel sounds. Ext: Good pulses. Trace edema.  Lab Results: Basic Metabolic Panel:  Lab 09/05/11 1610 09/04/11 1247  NA 142 140  K 3.9 3.2*  CL 104 100  CO2 28 30  GLUCOSE 162* 110*  BUN 15 15  CREATININE 1.08 1.34  CALCIUM 9.0 8.6  MG -- --  PHOS -- --   Liver Function Tests: No results found for this basename: AST:5,ALT:5,ALKPHOS:5,BILITOT:5,PROT:5,ALBUMIN:5 in the last 168 hours No results found for this basename: LIPASE:5,AMYLASE:5 in the last 168 hours No results found for this basename: AMMONIA:5 in the last 168 hours CBC:  Lab 09/05/11 0635 09/04/11 1247  WBC 9.5 9.4  NEUTROABS -- 7.0  HGB 15.0 14.8  HCT 44.0 43.1  MCV 90.3 91.5  PLT 183 179   Cardiac Enzymes:  Lab 09/04/11 2239 09/04/11 1650 09/04/11 1247  CKTOTAL 129 108 --  CKMB 2.1 1.9 --  CKMBINDEX -- -- --  TROPONINI <0.30 <0.30 <0.30   BNP (last 3 results)  Basename 09/04/11 1247  PROBNP 45.2   CBG: No results found for this basename: GLUCAP:5 in the last 168 hours No results found for this basename: HGBA1C:5 in the last 72 hours Other Labs: No components found with this basename: POCBNP:3 No results found for this basename: DDIMER:2 in  the last 168 hours No results found for this basename: CHOL:2,HDL:2,LDLCALC:2,TRIG:2,CHOLHDL:2,LDLDIRECT:2 in the last 168 hours No results found for this basename: TSH,T4TOTAL,FREET3,T3FREE,FREET4,THYROIDAB in the last 168 hours No results found for this basename: VITAMINB12:2,FOLATE:2,FERRITIN:2,TIBC:2,IRON:2,RETICCTPCT:2 in the last 168 hours  Micro Results: No results found for this or any previous visit (from the past 240 hour(s)).  Studies/Results: Dg Chest 2 View  09/04/2011  *RADIOLOGY REPORT*  Clinical Data: Shortness of breath, cough and dizziness.  CHEST - 2 VIEW  Comparison: Chest x-ray 11/26/2010.  Findings: There is mild diffuse interstitial prominence and peribronchial cuffing.  No acute consolidative airspace disease. No pleural effusions.  Pulmonary vasculature and the cardiomediastinal silhouette are within normal limits.  IMPRESSION: 1.  Mild diffuse interstitial prominence and peribronchial cuffing. Findings can be seen in the setting of acute bronchitis, or less likely reactive airway disease (no associated hyperexpansion on today's study).  Clinical correlation is recommended.  Original Report Authenticated By: Florencia Reasons, M.D.    Medications: I have reviewed the patient's current medications. Scheduled Meds:   . albuterol  2.5 mg Nebulization Q6H  . albuterol  5 mg Nebulization Once  . enoxaparin  40 mg Subcutaneous Q24H  . FLUoxetine  60 mg Oral q morning - 10a  . folic acid  1 mg Oral  Daily  . gabapentin  1,200 mg Oral QHS  . gabapentin  400 mg Oral BID  . guaiFENesin  600 mg Oral BID  . ipratropium  0.5 mg Nebulization Once  . ipratropium  0.5 mg Nebulization Q6H  . levofloxacin  500 mg Oral Daily  . methylPREDNISolone (SOLU-MEDROL) injection  40 mg Intravenous Q6H  . nicotine  14 mg Transdermal Daily  . potassium chloride  40 mEq Oral Once  . predniSONE  40 mg Oral Q breakfast  . thiamine  100 mg Oral Daily  . DISCONTD: azithromycin  500 mg Oral  Daily  . DISCONTD: cefTRIAXone (ROCEPHIN)  IV  1 g Intravenous Q24H  . DISCONTD: gabapentin  400-1,200 mg Oral TID  . DISCONTD: methylPREDNISolone (SOLU-MEDROL) injection  40 mg Intravenous Q6H   Continuous Infusions:   . DISCONTD: sodium chloride 50 mL/hr at 09/04/11 1959   PRN Meds:.acetaminophen, acetaminophen, albuterol, albuterol, guaiFENesin-dextromethorphan, haloperidol, ondansetron (ZOFRAN) IV, ondansetron, traZODone, zolpidem  Assessment/Plan: Acute respiratory failure with hypoxia  Due to reactive airway disease from bronchitis, patient does not have pulmonary function test confirmed diagnosis of COPD. Continue steroids, transitioned to oral. Continue neb treatments. Transition ceftriaxone and azithromycin to oral cefuroxime and azithromycin.  Antibiotics since 09/04/2011. Wean patient off oxygen as tolerated.   Reactive airway disease, presumed COPD  Management as indicated above. Once patient's respiratory status improves, patient will likely need outpatient pulmonary function tests to establish diagnosis of COPD.   Tobacco and alcohol use  Counseled the patient on cessation of tobacco. Continue Nicotine patches. Monitor for withdrawal from alcohol. Continue thiamine and folic acid.   Hypertension  Not on antihypertensive medications. Stable. Continue to monitor.   Syncope due to cough from bronchitis.  No events noted on telemetry.  Troponins negative x3.  Discontinue telemetry.  Hypokalemia  Resolved with replacement.  Hyperlipidemia  Stable. Further management as outpatient.   Chronic kidney disease stage II  Renal function improved with gentle hydration.  Suspect patient may have a component of dehydration from bronchitis.    Depression  Continue home medications.   Prophylaxis  Lovenox.   CODE STATUS  Full code.  Disposition  Depending on patient's breathing consider discharge in 1-2 days.   LOS: 1 day  Demetrick Eichenberger A, MD 09/05/2011, 9:32 AM

## 2011-09-06 DIAGNOSIS — F17200 Nicotine dependence, unspecified, uncomplicated: Secondary | ICD-10-CM

## 2011-09-06 DIAGNOSIS — J9600 Acute respiratory failure, unspecified whether with hypoxia or hypercapnia: Secondary | ICD-10-CM

## 2011-09-06 DIAGNOSIS — I10 Essential (primary) hypertension: Secondary | ICD-10-CM

## 2011-09-06 DIAGNOSIS — E871 Hypo-osmolality and hyponatremia: Secondary | ICD-10-CM

## 2011-09-06 MED ORDER — HYDROCOD POLST-CHLORPHEN POLST 10-8 MG/5ML PO LQCR
5.0000 mL | Freq: Two times a day (BID) | ORAL | Status: DC | PRN
  Administered 2011-09-06 (×2): 5 mL via ORAL
  Filled 2011-09-06 (×2): qty 5

## 2011-09-06 MED ORDER — METHYLPREDNISOLONE SODIUM SUCC 40 MG IJ SOLR
40.0000 mg | Freq: Four times a day (QID) | INTRAMUSCULAR | Status: AC
  Administered 2011-09-06 (×2): 40 mg via INTRAVENOUS
  Filled 2011-09-06 (×2): qty 1

## 2011-09-06 MED ORDER — BENZONATATE 100 MG PO CAPS
100.0000 mg | ORAL_CAPSULE | Freq: Three times a day (TID) | ORAL | Status: DC | PRN
  Administered 2011-09-06 – 2011-09-07 (×3): 100 mg via ORAL
  Filled 2011-09-06 (×3): qty 1

## 2011-09-06 NOTE — Progress Notes (Signed)
Subjective: Reports his breathing is better, but still wheezy and coughing. No other specific complaints.  Objective: Vital signs in last 24 hours: Filed Vitals:   09/05/11 2049 09/06/11 0123 09/06/11 0644 09/06/11 0820  BP: 144/90  142/87   Pulse: 98  84   Temp: 98 F (36.7 C)  98.8 F (37.1 C)   TempSrc: Oral  Oral   Resp: 20  22   Height:      Weight:   92 kg (202 lb 13.2 oz)   SpO2: 95% 95% 95% 95%   Weight change: -3.528 kg (-7 lb 12.4 oz)  Intake/Output Summary (Last 24 hours) at 09/06/11 1327 Last data filed at 09/06/11 0900  Gross per 24 hour  Intake    600 ml  Output    500 ml  Net    100 ml    Physical Exam: General: Awake, Oriented, No acute distress. HEENT: EOMI. Neck: Supple CV: S1 and S2 Lungs: Expiratory wheezing, improved from yesterday. Abdomen: Soft, Nontender, Nondistended, +bowel sounds. Ext: Good pulses. Trace edema.  Lab Results: Basic Metabolic Panel:  Lab 09/05/11 1610 09/04/11 1247  NA 142 140  K 3.9 3.2*  CL 104 100  CO2 28 30  GLUCOSE 162* 110*  BUN 15 15  CREATININE 1.08 1.34  CALCIUM 9.0 8.6  MG -- --  PHOS -- --   Liver Function Tests: No results found for this basename: AST:5,ALT:5,ALKPHOS:5,BILITOT:5,PROT:5,ALBUMIN:5 in the last 168 hours No results found for this basename: LIPASE:5,AMYLASE:5 in the last 168 hours No results found for this basename: AMMONIA:5 in the last 168 hours CBC:  Lab 09/05/11 0635 09/04/11 1247  WBC 9.5 9.4  NEUTROABS -- 7.0  HGB 15.0 14.8  HCT 44.0 43.1  MCV 90.3 91.5  PLT 183 179   Cardiac Enzymes:  Lab 09/04/11 2239 09/04/11 1650 09/04/11 1247  CKTOTAL 129 108 --  CKMB 2.1 1.9 --  CKMBINDEX -- -- --  TROPONINI <0.30 <0.30 <0.30   BNP (last 3 results)  Basename 09/04/11 1247  PROBNP 45.2   CBG: No results found for this basename: GLUCAP:5 in the last 168 hours No results found for this basename: HGBA1C:5 in the last 72 hours Other Labs: No components found with this basename:  POCBNP:3 No results found for this basename: DDIMER:2 in the last 168 hours No results found for this basename: CHOL:2,HDL:2,LDLCALC:2,TRIG:2,CHOLHDL:2,LDLDIRECT:2 in the last 168 hours No results found for this basename: TSH,T4TOTAL,FREET3,T3FREE,FREET4,THYROIDAB in the last 168 hours No results found for this basename: VITAMINB12:2,FOLATE:2,FERRITIN:2,TIBC:2,IRON:2,RETICCTPCT:2 in the last 168 hours  Micro Results: No results found for this or any previous visit (from the past 240 hour(s)).  Studies/Results: No results found.  Medications: I have reviewed the patient's current medications. Scheduled Meds:    . albuterol  2.5 mg Nebulization Q6H  . azithromycin  250 mg Oral Daily  . cefUROXime  500 mg Oral BID WC  . enoxaparin  40 mg Subcutaneous Q24H  . FLUoxetine  60 mg Oral q morning - 10a  . folic acid  1 mg Oral Daily  . gabapentin  1,200 mg Oral QHS  . gabapentin  400 mg Oral BID  . guaiFENesin  600 mg Oral BID  . ipratropium  0.5 mg Nebulization Q6H  . methylPREDNISolone (SOLU-MEDROL) injection  40 mg Intravenous Q6H  . nicotine  14 mg Transdermal Daily  . predniSONE  40 mg Oral Q breakfast  . thiamine  100 mg Oral Daily   Continuous Infusions:  PRN Meds:.acetaminophen, acetaminophen, albuterol, benzonatate,  chlorpheniramine-HYDROcodone, guaiFENesin-dextromethorphan, haloperidol, ondansetron (ZOFRAN) IV, ondansetron, traZODone, zolpidem  Assessment/Plan: Acute respiratory failure with hypoxia  Due to reactive airway disease from bronchitis, patient does not have pulmonary function test confirmed diagnosis of COPD. Continue steroids, will give two doses of IV solumedrol given persistent wheezing. Continue neb treatments. Transition ceftriaxone and azithromycin to oral cefuroxime and azithromycin.  Antibiotics since 09/04/2011. Wean patient off oxygen as tolerated. PRN benzonatate for cough.  Reactive airway disease, presumed COPD  Management as indicated above. Once  patient's respiratory status improves, patient will likely need outpatient pulmonary function tests to establish diagnosis of COPD.   Tobacco and alcohol use  Counseled the patient on cessation of tobacco. Continue Nicotine patches. Monitor for withdrawal from alcohol. Continue thiamine and folic acid.   Hypertension  Not on antihypertensive medications. Stable. Continue to monitor.   Syncope due to cough from bronchitis.  No events noted on telemetry.  Troponins negative x3.  Discontinue telemetry.  Hypokalemia  Resolved with replacement.  Hyperlipidemia  Stable. Further management as outpatient.   Chronic kidney disease stage II  Renal function improved with gentle hydration.  Suspect patient may have a component of dehydration from bronchitis.    Depression  Continue home medications.   Prophylaxis  Lovenox.   CODE STATUS  Full code.  Disposition  Depending on patient's breathing consider discharge in 1-2 days. Called patient's mother [phone] and updated her.   LOS: 2 days  Gabriel Miller A, MD 09/06/2011, 1:27 PM

## 2011-09-07 DIAGNOSIS — F17200 Nicotine dependence, unspecified, uncomplicated: Secondary | ICD-10-CM

## 2011-09-07 DIAGNOSIS — J9600 Acute respiratory failure, unspecified whether with hypoxia or hypercapnia: Secondary | ICD-10-CM

## 2011-09-07 DIAGNOSIS — E871 Hypo-osmolality and hyponatremia: Secondary | ICD-10-CM

## 2011-09-07 DIAGNOSIS — I10 Essential (primary) hypertension: Secondary | ICD-10-CM

## 2011-09-07 LAB — BASIC METABOLIC PANEL
BUN: 20 mg/dL (ref 6–23)
Chloride: 99 mEq/L (ref 96–112)
GFR calc Af Amer: 82 mL/min — ABNORMAL LOW (ref >90)
GFR calc non Af Amer: 70 mL/min — ABNORMAL LOW (ref >90)
Potassium: 3.7 mEq/L (ref 3.5–5.1)
Sodium: 137 mEq/L (ref 135–145)

## 2011-09-07 LAB — CBC
HCT: 43.1 % (ref 39.0–52.0)
MCHC: 33.9 g/dL (ref 30.0–36.0)
Platelets: 219 10*3/uL (ref 150–400)
RDW: 14.7 % (ref 11.5–15.5)
WBC: 25.4 10*3/uL — ABNORMAL HIGH (ref 4.0–10.5)

## 2011-09-07 MED ORDER — TIOTROPIUM BROMIDE MONOHYDRATE 18 MCG IN CAPS: 18.0000 ug | ORAL_CAPSULE | Freq: Every day | RESPIRATORY_TRACT | Status: DC

## 2011-09-07 MED ORDER — MOXIFLOXACIN HCL 400 MG PO TABS: 400.0000 mg | ORAL_TABLET | Freq: Every day | ORAL | Status: AC

## 2011-09-07 MED ORDER — PREDNISONE 10 MG PO TABS: ORAL_TABLET | ORAL | Status: DC

## 2011-09-07 MED ORDER — FLUTICASONE-SALMETEROL 250-50 MCG/DOSE IN AEPB
1.0000 | INHALATION_SPRAY | Freq: Two times a day (BID) | RESPIRATORY_TRACT | Status: DC
  Filled 2011-09-07: qty 14

## 2011-09-07 MED ORDER — FLUTICASONE-SALMETEROL 250-50 MCG/DOSE IN AEPB: 1.0000 | INHALATION_SPRAY | Freq: Two times a day (BID) | RESPIRATORY_TRACT | Status: DC

## 2011-09-07 MED ORDER — ALBUTEROL SULFATE (5 MG/ML) 0.5% IN NEBU: 2.5000 mg | INHALATION_SOLUTION | RESPIRATORY_TRACT | Status: AC | PRN

## 2011-09-07 MED ORDER — HYDROCOD POLST-CHLORPHEN POLST 10-8 MG/5ML PO LQCR: 5.0000 mL | Freq: Two times a day (BID) | ORAL | Status: DC | PRN

## 2011-09-07 MED ORDER — ALBUTEROL SULFATE HFA 108 (90 BASE) MCG/ACT IN AERS: 2.0000 | INHALATION_SPRAY | RESPIRATORY_TRACT | Status: DC | PRN

## 2011-09-07 MED ORDER — NICOTINE 14 MG/24HR TD PT24: 1.0000 | MEDICATED_PATCH | Freq: Every day | TRANSDERMAL | Status: AC

## 2011-09-07 MED ORDER — TIOTROPIUM BROMIDE MONOHYDRATE 18 MCG IN CAPS
18.0000 ug | ORAL_CAPSULE | Freq: Every day | RESPIRATORY_TRACT | Status: DC
  Filled 2011-09-07: qty 5

## 2011-09-07 NOTE — Progress Notes (Signed)
Ambulated pt on room air and oxygen saturation stayed at 93-94%. Oxygen saturation at rest was 95%.

## 2011-09-07 NOTE — Discharge Summary (Signed)
PATIENT DETAILS Name: Gabriel Miller Age: 51 y.o. Sex: male Date of Birth: [birth year] MRN: [id number]. Admit Date: 09/04/2011 Admitting Physician: Cristal Ford, MD NWG:[id number],VHQIO W, MD  PRIMARY DISCHARGE DIAGNOSIS:  Principal Problem:  *Acute respiratory failure with hypoxia Active Problems:  DYSLIPIDEMIA  BPLR I, DEPRESSED, MOST RECENT EPSD NOS  HYPERTENSION, BENIGN  Bronchitis  COPD exacerbation  Hypokalemia  Cough syncope  CKD (chronic kidney disease), stage II      PAST MEDICAL HISTORY: Past Medical History  Diagnosis Date  . Alcohol abuse   . Depression   . GERD (gastroesophageal reflux disease)   . Hypertension     DISCHARGE MEDICATIONS: Medication List  As of 09/07/2011 11:16 AM   TAKE these medications         albuterol 108 (90 BASE) MCG/ACT inhaler   Commonly known as: PROVENTIL HFA;VENTOLIN HFA   Inhale 2 puffs into the lungs every 4 (four) hours as needed for wheezing or shortness of breath.      albuterol (5 MG/ML) 0.5% nebulizer solution   Commonly known as: PROVENTIL   Take 0.5 mLs (2.5 mg total) by nebulization every 2 (two) hours as needed for wheezing or shortness of breath.      chlorpheniramine-HYDROcodone 10-8 MG/5ML Lqcr   Commonly known as: TUSSIONEX   Take 5 mLs by mouth every 12 (twelve) hours as needed (for cough).      FLUoxetine 20 MG capsule   Commonly known as: PROZAC   Take 60 mg by mouth every morning.      Fluticasone-Salmeterol 250-50 MCG/DOSE Aepb   Commonly known as: ADVAIR   Inhale 1 puff into the lungs 2 (two) times daily.      gabapentin 400 MG capsule   Commonly known as: NEURONTIN   Take 400-1,200 mg by mouth 3 (three) times daily. Take 400 MG twice a day and take 1200 MG at bedtime.      haloperidol 5 MG tablet   Commonly known as: HALDOL   Take 5 mg by mouth at bedtime.      moxifloxacin 400 MG tablet   Commonly known as: AVELOX   Take 1 tablet (400 mg total) by mouth daily.      nicotine 14 mg/24hr patch     Commonly known as: NICODERM CQ - dosed in mg/24 hours   Place 1 patch onto the skin daily.      predniSONE 10 MG tablet   Commonly known as: DELTASONE   Take 4 tablets daily for 4 days, then  Take 3 tablets daily for 4 days, then  Take 2 tablets daily for 4 days, then  Take 1 tablet daily for 2 days and then stop      tiotropium 18 MCG inhalation capsule   Commonly known as: SPIRIVA   Place 1 capsule (18 mcg total) into inhaler and inhale daily.      traZODone 50 MG tablet   Commonly known as: DESYREL   Take 50 mg by mouth at bedtime as needed. For sleep.            BRIEF HPI:  See H&P, Labs, Consult and Test reports for all details in brief, Gabriel Miller is a 51 y.o. Caucasian male with history of hypertension, depression, GERD, tobacco and alcohol use who presented with cough and SOB. He notes that his symptoms started on 09/01/2011 with cough, which since then has progressed. He is coughing up phlegm. Apparently a day prior to admission,  he noted that he coughs  so much that he lost consciousness for a second before he regained consciousness. He denies hitting his head. Given his worsening cough or shortness of breath he presented to the emergency department for further evaluation. In the emergency department per nursing on room air patient's saturation dropped down to 88%. He was wheezy on exam. Despite neb treatments his symptoms did not improve as a result the hospitalist service was asked to admit the patient for further care and management.   CONSULTATIONS:   None  PERTINENT RADIOLOGIC STUDIES: Dg Chest 2 View  09/04/2011  *RADIOLOGY REPORT*  Clinical Data: Shortness of breath, cough and dizziness.  CHEST - 2 VIEW  Comparison: Chest x-ray 11/26/2010.  Findings: There is mild diffuse interstitial prominence and peribronchial cuffing.  No acute consolidative airspace disease. No pleural effusions.  Pulmonary vasculature and the cardiomediastinal silhouette are within normal  limits.  IMPRESSION: 1.  Mild diffuse interstitial prominence and peribronchial cuffing. Findings can be seen in the setting of acute bronchitis, or less likely reactive airway disease (no associated hyperexpansion on today's study).  Clinical correlation is recommended.  Original Report Authenticated By: Florencia Reasons, M.D.     PERTINENT LAB RESULTS: CBC:  Basename 09/07/11 0536 09/05/11 0635  WBC 25.4* 9.5  HGB 14.6 15.0  HCT 43.1 44.0  PLT 219 183   CMET CMP     Component Value Date/Time   NA 137 09/07/2011 0536   K 3.7 09/07/2011 0536   CL 99 09/07/2011 0536   CO2 27 09/07/2011 0536   GLUCOSE 152* 09/07/2011 0536   BUN 20 09/07/2011 0536   CREATININE 1.18 09/07/2011 0536   CALCIUM 9.4 09/07/2011 0536   GFRNONAA 70* 09/07/2011 0536   GFRAA 82* 09/07/2011 0536    GFR Estimated Creatinine Clearance: 84 ml/min (by C-G formula based on Cr of 1.18). No results found for this basename: LIPASE:2,AMYLASE:2 in the last 72 hours  Basename 09/04/11 2239 09/04/11 1650 09/04/11 1247  CKTOTAL 129 108 --  CKMB 2.1 1.9 --  CKMBINDEX -- -- --  TROPONINI <0.30 <0.30 <0.30   No components found with this basename: POCBNP:3 No results found for this basename: DDIMER:2 in the last 72 hours No results found for this basename: HGBA1C:2 in the last 72 hours No results found for this basename: CHOL:2,HDL:2,LDLCALC:2,TRIG:2,CHOLHDL:2,LDLDIRECT:2 in the last 72 hours No results found for this basename: TSH,T4TOTAL,FREET3,T3FREE,THYROIDAB in the last 72 hours No results found for this basename: VITAMINB12:2,FOLATE:2,FERRITIN:2,TIBC:2,IRON:2,RETICCTPCT:2 in the last 72 hours Coags: No results found for this basename: PT:2,INR:2 in the last 72 hours Microbiology: No results found for this or any previous visit (from the past 240 hour(s)).   BRIEF HOSPITAL COURSE:   Principal Problem:  Acute respiratory failure with hypoxia  Due to reactive airway disease from bronchitis, patient does not have  pulmonary function test confirmed diagnosis of COPD. Patient was admitted-and placed on IV Solumedrol, Nebulized bronchodilators and empiric Rocephin and Zithromax He slowly made clinical improvement with these measures, by the day of discharge he is much better and has ambulated in the hallway, without significant drop in his O2 saturations.   Reactive airway disease, presumed COPD  -Patient was admitted-and placed on IV Solumedrol, Nebulized bronchodilators and empiric Rocephin and Zithromax. -He has a long h/o smoking, per patient-he has been actually having trouble with exertional dyspnea for the past several months, but was really worse a few days before admission -He currently feels significantly better, lungs are clear to exam, he has ambulated in the hallway-without any  difficulty, his O2 saturation was in the mid to low 90's during ambulation -patient will be discharged home on tapering steroids, avelox for another 4 more days and inhalers as noted above -patient will need a outpatient PFT when he is back to usual baseline, he has been set up by me to see Pulmonology on 7/1.  Tobacco and alcohol use  Counseled the patient on cessation of tobacco.  Continue Nicotine patches.  Syncope due to cough from bronchitis.-likely vasovagal  No events noted on telemetry. Troponins negative x3.   Hypokalemia  Resolved with replacement.   Depression  Continue home medications.      TODAY-DAY OF DISCHARGE:  Subjective:   Gabriel Miller today has no headache,no chest abdominal pain,no new weakness tingling or numbness, feels much better wants to go home today. He has ambulated in the hallway-and feels much better  Objective:   Blood pressure 130/81, pulse 85, temperature 98.4 F (36.9 C), temperature source Oral, resp. rate 18, height 5\' 9"  (1.753 m), weight 92.3 kg (203 lb 7.8 oz), SpO2 93.00%.  Intake/Output Summary (Last 24 hours) at 09/07/11 1116 Last data filed at 09/07/11 0820   Gross per 24 hour  Intake    840 ml  Output   1800 ml  Net   -960 ml    Exam Awake Alert, Oriented *3, No new F.N deficits, Normal affect [place].AT,PERRAL Supple Neck,No JVD, No cervical lymphadenopathy appriciated.  Symmetrical Chest wall movement, Good air movement bilaterally,currently b/l clear RRR,No Gallops,Rubs or new Murmurs, No Parasternal Heave +ve B.Sounds, Abd Soft, Non tender, No organomegaly appriciated, No rebound -guarding or rigidity. No Cyanosis, Clubbing or edema, No new Rash or bruise  DISPOSITION: Home  DISCHARGE INSTRUCTIONS:    Follow-up Information    Follow up with Kristian Covey, MD. Schedule an appointment as soon as possible for a visit in 1 week.   Contact information:   [address] [place] [address] [phone]       Follow up with [facility], NP on 09/19/2011. (appoitnmet at 10 am-please be there 15 minutes  before)    Contact information:   [employer] [address] [place] [address] [phone]         Total Time spent on discharge equals 45 minutes.  SignedJeoffrey Massed 09/07/2011 11:16 AM

## 2011-09-07 NOTE — Progress Notes (Signed)
Lynnell Dike to be D/C'd Home per MD order.  Discussed with the patient and all questions fully answered.   Delta, Deshmukh  [facility] Medication Instructions VHQ:[id number]   Printed on:09/07/11 1151  Medication Information                    gabapentin (NEURONTIN) 400 MG capsule Take 400-1,200 mg by mouth 3 (three) times daily. Take 400 MG twice a day and take 1200 MG at bedtime.           FLUoxetine (PROZAC) 20 MG capsule Take 60 mg by mouth every morning.            haloperidol (HALDOL) 5 MG tablet Take 5 mg by mouth at bedtime.            traZODone (DESYREL) 50 MG tablet Take 50 mg by mouth at bedtime as needed. For sleep.           albuterol (PROVENTIL HFA;VENTOLIN HFA) 108 (90 BASE) MCG/ACT inhaler Inhale 2 puffs into the lungs every 4 (four) hours as needed for wheezing or shortness of breath.           albuterol (PROVENTIL) (5 MG/ML) 0.5% nebulizer solution Take 0.5 mLs (2.5 mg total) by nebulization every 2 (two) hours as needed for wheezing or shortness of breath.           chlorpheniramine-HYDROcodone (TUSSIONEX) 10-8 MG/5ML LQCR Take 5 mLs by mouth every 12 (twelve) hours as needed (for cough).           Fluticasone-Salmeterol (ADVAIR) 250-50 MCG/DOSE AEPB Inhale 1 puff into the lungs 2 (two) times daily.           nicotine (NICODERM CQ - DOSED IN MG/24 HOURS) 14 mg/24hr patch Place 1 patch onto the skin daily.           predniSONE (DELTASONE) 10 MG tablet Take 4 tablets daily for 4 days, then Take 3 tablets daily for 4 days, then Take 2 tablets daily for 4 days, then Take 1 tablet daily for 2 days and then stop           tiotropium (SPIRIVA) 18 MCG inhalation capsule Place 1 capsule (18 mcg total) into inhaler and inhale daily.           moxifloxacin (AVELOX) 400 MG tablet Take 1 tablet (400 mg total) by mouth daily.             VVS, Skin clean, dry and intact without evidence of skin break down, no evidence of skin tears noted. IV catheter discontinued intact.  Site without signs and symptoms of complications. Dressing and pressure applied.  An After Visit Summary was printed and given to the patient. Patient escorted via WC, and D/C home via private auto.  Kennyth Arnold D 09/07/2011 11:51 AM

## 2011-09-09 ENCOUNTER — Ambulatory Visit (INDEPENDENT_AMBULATORY_CARE_PROVIDER_SITE_OTHER): Payer: Medicare Other | Admitting: Family Medicine

## 2011-09-09 ENCOUNTER — Encounter: Payer: Self-pay | Admitting: Family Medicine

## 2011-09-09 VITALS — BP 110/84 | Temp 98.4°F | Wt 204.0 lb

## 2011-09-09 DIAGNOSIS — Z23 Encounter for immunization: Secondary | ICD-10-CM

## 2011-09-09 DIAGNOSIS — J441 Chronic obstructive pulmonary disease with (acute) exacerbation: Secondary | ICD-10-CM

## 2011-09-09 DIAGNOSIS — N182 Chronic kidney disease, stage 2 (mild): Secondary | ICD-10-CM

## 2011-09-09 NOTE — Progress Notes (Signed)
  Subjective:    Patient ID: Gabriel Miller, male    DOB: [birth year], 51 y.o.   MRN: [id number]  HPI  Hospital followup. Patient has long history of smoking but no confirmed COPD. He presented with cough and shortness of breath. Symptoms started around 09/01/2011. Cough was productive. One day prior to admission patient had apparently cough syncope. No syncope since then. Denied any head injury. O2 saturation in the emergency department 88%. Unimproved with nebulizer treatment. Patient was admitted for further evaluation. Chest x-ray showed peribronchial cuffing consistent with acute bronchitis. No pneumonia. Patient started on IV Solu-Medrol, nebulizers, and treated apparently with Rocephin and Zithromax.  Patient gradually improved. Discharged on Spiriva, Advair, prednisone taper, Avelox, and albuterol for as needed use. He is still having some cough and wheezing but overall greatly improved. No hemoptysis. No smoking since admission. Patient never had pulmonary function testing. No history of confirmed Pneumovax.  Past Medical History  Diagnosis Date  . Alcohol abuse   . Depression   . GERD (gastroesophageal reflux disease)   . Hypertension    Past Surgical History  Procedure Date  . Middle ear surgery 1990    reports that he has been smoking Cigarettes.  He has a 220 pack-year smoking history. He does not have any smokeless tobacco history on file. He reports that he drinks alcohol. His drug history not on file. family history includes Alcohol abuse in his father; Arthritis in his mother; COPD in his mother; Cancer in his maternal aunt and maternal grandmother; Diabetes in his father, maternal grandfather, maternal grandmother, mother, and paternal grandfather; Heart attack in his father; Heart disease in his mother; and Hypertension in his father, maternal aunt, and mother. No Known Allergies    Review of Systems  Constitutional: Negative for fever and chills.  HENT: Negative for  congestion.   Respiratory: Positive for cough, shortness of breath (None at rest) and wheezing.   Cardiovascular: Negative for chest pain and leg swelling.  Neurological: Negative for dizziness and syncope.       Objective:   Physical Exam  Constitutional: He appears well-developed and well-nourished.  HENT:  Right Ear: External ear normal.  Left Ear: External ear normal.  Mouth/Throat: Oropharynx is clear and moist.  Neck: Neck supple.  Cardiovascular: Normal rate and regular rhythm.   Pulmonary/Chest:       Patient still some diffuse wheezes. No rales. No retractions. O2 sat 94%  Lymphadenopathy:    He has no cervical adenopathy.          Assessment & Plan:  Recent admission for probable acute exacerbation of COPD. Clinically improved. Finish out antibiotic and prednisone. Continue Advair and Spiriva for now. Followup in 2 weeks. We'll need pulmonary function testing. We have strongly encouraged him not to resume nicotine use.  Pneumovax given. Reminder for yearly flu vaccine.

## 2011-09-09 NOTE — Patient Instructions (Addendum)
Continue regular use of Spiriva and Advair until follow up Finish out prednisone and antibiotic.

## 2011-09-19 ENCOUNTER — Inpatient Hospital Stay: Payer: Medicare Other | Admitting: Adult Health

## 2011-09-23 ENCOUNTER — Ambulatory Visit (INDEPENDENT_AMBULATORY_CARE_PROVIDER_SITE_OTHER): Payer: Medicare Other | Admitting: Family Medicine

## 2011-09-23 ENCOUNTER — Encounter: Payer: Self-pay | Admitting: Family Medicine

## 2011-09-23 VITALS — BP 150/102 | Temp 97.6°F | Wt 214.0 lb

## 2011-09-23 DIAGNOSIS — R0600 Dyspnea, unspecified: Secondary | ICD-10-CM

## 2011-09-23 DIAGNOSIS — R030 Elevated blood-pressure reading, without diagnosis of hypertension: Secondary | ICD-10-CM

## 2011-09-23 DIAGNOSIS — R739 Hyperglycemia, unspecified: Secondary | ICD-10-CM

## 2011-09-23 DIAGNOSIS — R0609 Other forms of dyspnea: Secondary | ICD-10-CM

## 2011-09-23 DIAGNOSIS — R7309 Other abnormal glucose: Secondary | ICD-10-CM

## 2011-09-23 LAB — GLUCOSE, POCT (MANUAL RESULT ENTRY): POC Glucose: 127 mg/dl — AB (ref 70–99)

## 2011-09-23 NOTE — Patient Instructions (Addendum)
Stay off cigarettes.   Continue with both inhalers (Advair and Spiriva) Schedule complete physical.

## 2011-09-23 NOTE — Progress Notes (Signed)
  Subjective:    Patient ID: Gabriel Miller, male    DOB: [birth year], 51 y.o.   MRN: [id number]  HPI  Patient seen for followup from recent hospital followup visit. Admitted for dyspnea and cough with cough syncope.  Diagnosis was COPD exacerbation but he has never been diagnosed with COPD. Not confirmed but he has never had spirometry or any other pulmonary function testing. Symptomatically much improved. No cough. Dyspnea improved. Patient states he's taking Spiriva and Advair regularly. He has not resumed smoking. Uses Proventil as needed.  Patient had elevated blood sugars during recent admission. Not clear if these were fasting. No symptoms of hyperglycemia.  He was on prednisone though during admission which likely exacerbated. Does have family history of type 2 diabetes in mother.  Past Medical History  Diagnosis Date  . Alcohol abuse   . Depression   . GERD (gastroesophageal reflux disease)   . Hypertension    Past Surgical History  Procedure Date  . Middle ear surgery 1990    reports that he has been smoking Cigarettes.  He has a 220 pack-year smoking history. He does not have any smokeless tobacco history on file. He reports that he drinks alcohol. His drug history not on file. family history includes Alcohol abuse in his father; Arthritis in his mother; COPD in his mother; Cancer in his maternal aunt and maternal grandmother; Diabetes in his father, maternal grandfather, maternal grandmother, mother, and paternal grandfather; Heart attack in his father; Heart disease in his mother; and Hypertension in his father, maternal aunt, and mother. No Known Allergies    Review of Systems  Constitutional: Negative for fever, chills, appetite change and unexpected weight change.  HENT: Negative for trouble swallowing.   Respiratory: Negative for cough and shortness of breath.   Cardiovascular: Negative for chest pain, palpitations and leg swelling.  Gastrointestinal: Negative for  abdominal pain.  Neurological: Negative for syncope and headaches.       Objective:   Physical Exam  Constitutional: He appears well-developed and well-nourished.  Neck: Neck supple. No thyromegaly present.  Cardiovascular: Normal rate and regular rhythm.   Pulmonary/Chest: Effort normal.       Patient still has some faint expiratory wheezes but no rales. Normal respiratory rate.  Musculoskeletal: He exhibits no edema.       Small abrasion left anterior knee          Assessment & Plan:  #1 recent dyspnea with probable COPD exacerbation. Check pulmonary function test now he is back to baseline. We'll likely need to continue with Spiriva and Advair. Congratulated on not smoking #2 recent hyperglycemia probably exacerbated her prednisone. Check fasting blood sugar today #3 health maintenance. We have suggested complete physical. Needs lipid panel among other preventative items #4 elevated blood pressure. Not previously diagnosed with hypertension. 2 previous readings here been normal. Reassess at followup within a couple months. Work on weight loss.  Spirometry reveals severe obstruction with low vital capacity.

## 2011-11-23 ENCOUNTER — Other Ambulatory Visit: Payer: Medicare Other

## 2011-11-23 ENCOUNTER — Ambulatory Visit (INDEPENDENT_AMBULATORY_CARE_PROVIDER_SITE_OTHER): Payer: Medicare Other | Admitting: Family Medicine

## 2011-11-23 ENCOUNTER — Encounter: Payer: Self-pay | Admitting: Family Medicine

## 2011-11-23 VITALS — BP 150/100 | Temp 97.6°F | Wt 217.0 lb

## 2011-11-23 DIAGNOSIS — R102 Pelvic and perineal pain: Secondary | ICD-10-CM

## 2011-11-23 DIAGNOSIS — M25552 Pain in left hip: Secondary | ICD-10-CM

## 2011-11-23 DIAGNOSIS — R109 Unspecified abdominal pain: Secondary | ICD-10-CM

## 2011-11-23 DIAGNOSIS — Z125 Encounter for screening for malignant neoplasm of prostate: Secondary | ICD-10-CM

## 2011-11-23 DIAGNOSIS — M25559 Pain in unspecified hip: Secondary | ICD-10-CM

## 2011-11-23 DIAGNOSIS — I10 Essential (primary) hypertension: Secondary | ICD-10-CM

## 2011-11-23 LAB — CBC WITH DIFFERENTIAL/PLATELET
Basophils Relative: 0.3 % (ref 0.0–3.0)
HCT: 47.6 % (ref 39.0–52.0)
Hemoglobin: 15.9 g/dL (ref 13.0–17.0)
Lymphocytes Relative: 23.2 % (ref 12.0–46.0)
Lymphs Abs: 1.7 10*3/uL (ref 0.7–4.0)
MCHC: 33.5 g/dL (ref 30.0–36.0)
Monocytes Relative: 8.9 % (ref 3.0–12.0)
Neutro Abs: 4.3 10*3/uL (ref 1.4–7.7)
RBC: 5.17 Mil/uL (ref 4.22–5.81)

## 2011-11-23 LAB — BASIC METABOLIC PANEL
CO2: 29 mEq/L (ref 19–32)
Calcium: 9.2 mg/dL (ref 8.4–10.5)
Creatinine, Ser: 1.4 mg/dL (ref 0.4–1.5)
GFR: 56.47 mL/min — ABNORMAL LOW (ref >60.00)
Sodium: 137 mEq/L (ref 135–145)

## 2011-11-23 LAB — HEPATIC FUNCTION PANEL
Bilirubin, Direct: 0.1 mg/dL (ref 0.0–0.3)
Total Protein: 6.9 g/dL (ref 6.0–8.3)

## 2011-11-23 LAB — PSA: PSA: 0.53 ng/mL (ref 0.10–4.00)

## 2011-11-23 MED ORDER — HYDROCODONE-ACETAMINOPHEN 5-325 MG PO TABS: 1.0000 | ORAL_TABLET | Freq: Four times a day (QID) | ORAL | Status: AC | PRN

## 2011-11-23 MED ORDER — LOSARTAN POTASSIUM 50 MG PO TABS: 50.0000 mg | ORAL_TABLET | Freq: Every day | ORAL | Status: DC

## 2011-11-23 NOTE — Progress Notes (Signed)
  Subjective:    Patient ID: Gabriel Miller, male    DOB: Apr 30, 1960, 51 y.o.   MRN: [id number]  HPI  Patient seen with two-year history of left iliac crest region pain. He describes more of a dull pain. Worse supine and at rest. Occasional with walking but worse supine. No radiation. No abdominal pain. No stool changes. Severity is moderate. Sometimes waking at night. No appetite or weight changes. Not tried anything to alleviate. No heat or ice. No medications. No history of injury.  History of elevated blood pressure last visit. No headaches. No dizziness. No chest pains. Never treated for hypertension.  Past Medical History  Diagnosis Date  . Alcohol abuse   . Depression   . GERD (gastroesophageal reflux disease)   . Hypertension    Past Surgical History  Procedure Date  . Middle ear surgery 1990    reports that he has been smoking Cigarettes.  He has a 220 pack-year smoking history. He does not have any smokeless tobacco history on file. He reports that he drinks alcohol. His drug history not on file. family history includes Alcohol abuse in his father; Arthritis in his mother; COPD in his mother; Cancer in his maternal aunt and maternal grandmother; Diabetes in his father, maternal grandfather, maternal grandmother, mother, and paternal grandfather; Heart attack in his father; Heart disease in his mother; and Hypertension in his father, maternal aunt, and mother. No Known Allergies    Review of Systems  Constitutional: Negative for fever, chills, appetite change and unexpected weight change.  Eyes: Negative for visual disturbance.  Respiratory: Negative for cough and shortness of breath.   Cardiovascular: Negative for chest pain and leg swelling.  Gastrointestinal: Negative for abdominal pain.       Objective:   Physical Exam  Constitutional: He appears well-developed and well-nourished.  Cardiovascular: Normal rate and regular rhythm.   Pulmonary/Chest: Effort normal and  breath sounds normal.  Abdominal: Soft. There is no tenderness.  Musculoskeletal:       Patient has some tenderness on palpating around the left iliac crest region. He has mild pain with internal and external rotation left hip but fairly good range of motion. No left lateral hip tenderness. Straight leg raise negative.  Neurological:       Full-strength lower extremity. Symmetric reflexes  Skin: No rash noted.          Assessment & Plan:  #1 left pelvic/iliac crest pain. Somewhat poorly localized. Unusual location. Duration of 2 years. Start with x-rays. Given unusual location, check additional labs with chemistries, PSA #2 hypertension. Currently untreated. Start losartan 50 mg once daily. Follow up 3-4 weeks to reassess.

## 2011-11-24 NOTE — Progress Notes (Signed)
Quick Note:  Pt mother informed DPR, pt has CPX scheduled for later this month ______

## 2011-11-29 ENCOUNTER — Ambulatory Visit (INDEPENDENT_AMBULATORY_CARE_PROVIDER_SITE_OTHER)
Admission: RE | Admit: 2011-11-29 | Discharge: 2011-11-29 | Disposition: A | Discharge status: Home / Self Care | Payer: Medicare Other | Source: Ambulatory Visit | Attending: Family Medicine | Admitting: Family Medicine

## 2011-11-29 DIAGNOSIS — R102 Pelvic and perineal pain: Secondary | ICD-10-CM

## 2011-11-29 DIAGNOSIS — R109 Unspecified abdominal pain: Secondary | ICD-10-CM

## 2011-11-29 DIAGNOSIS — M25552 Pain in left hip: Secondary | ICD-10-CM

## 2011-11-29 DIAGNOSIS — M25559 Pain in unspecified hip: Secondary | ICD-10-CM

## 2011-11-29 NOTE — Progress Notes (Signed)
Quick Note:  Pt mother informed ______ 

## 2011-12-14 ENCOUNTER — Ambulatory Visit (INDEPENDENT_AMBULATORY_CARE_PROVIDER_SITE_OTHER): Payer: Medicare Other | Admitting: Family Medicine

## 2011-12-14 ENCOUNTER — Encounter: Payer: Self-pay | Admitting: Family Medicine

## 2011-12-14 VITALS — BP 118/80 | HR 72 | Temp 98.1°F | Resp 12 | Ht 69.5 in | Wt 209.0 lb

## 2011-12-14 DIAGNOSIS — Z23 Encounter for immunization: Secondary | ICD-10-CM

## 2011-12-14 DIAGNOSIS — Z299 Encounter for prophylactic measures, unspecified: Secondary | ICD-10-CM

## 2011-12-14 DIAGNOSIS — Z79899 Other long term (current) drug therapy: Secondary | ICD-10-CM

## 2011-12-14 DIAGNOSIS — M545 Low back pain: Secondary | ICD-10-CM

## 2011-12-14 DIAGNOSIS — Z0000 Encounter for general adult medical examination without abnormal findings: Secondary | ICD-10-CM

## 2011-12-14 LAB — POCT URINALYSIS DIPSTICK
Leukocytes, UA: NEGATIVE
Nitrite, UA: NEGATIVE
Protein, UA: NEGATIVE
Urobilinogen, UA: 0.2

## 2011-12-14 LAB — LDL CHOLESTEROL, DIRECT: Direct LDL: 125.2 mg/dL

## 2011-12-14 LAB — LIPID PANEL
HDL: 24.5 mg/dL — ABNORMAL LOW (ref >39.00)
VLDL: 138 mg/dL — ABNORMAL HIGH (ref 0.0–40.0)

## 2011-12-14 MED ORDER — LIDOCAINE 5 % EX PTCH: 1.0000 | MEDICATED_PATCH | CUTANEOUS | Status: DC

## 2011-12-14 MED ORDER — TETANUS-DIPHTH-ACELL PERTUSSIS 5-2.5-18.5 LF-MCG/0.5 IM SUSP: 0.5000 mL | Freq: Once | INTRAMUSCULAR | Status: DC

## 2011-12-14 NOTE — Patient Instructions (Signed)
Smoking Cessation This document explains the best ways for you to quit smoking and new treatments to help. It lists new medicines that can double or triple your chances of quitting and quitting for good. It also considers ways to avoid relapses and concerns you may have about quitting, including weight gain. NICOTINE: A POWERFUL ADDICTION If you have tried to quit smoking, you know how hard it can be. It is hard because nicotine is a very addictive drug. For some people, it can be as addictive as heroin or cocaine. Usually, people make 2 or 3 tries, or more, before finally being able to quit. Each time you try to quit, you can learn about what helps and what hurts. Quitting takes hard work and a lot of effort, but you can quit smoking. QUITTING SMOKING IS ONE OF THE MOST IMPORTANT THINGS YOU WILL EVER DO.  You will live longer, feel better, and live better.   The impact on your body of quitting smoking is felt almost immediately:   Within 20 minutes, blood pressure decreases. Pulse returns to its normal level.   After 8 hours, carbon monoxide levels in the blood return to normal. Oxygen level increases.   After 24 hours, chance of heart attack starts to decrease. Breath, hair, and body stop smelling like smoke.   After 48 hours, damaged nerve endings begin to recover. Sense of taste and smell improve.   After 72 hours, the body is virtually free of nicotine. Bronchial tubes relax and breathing becomes easier.   After 2 to 12 weeks, lungs can hold more air. Exercise becomes easier and circulation improves.   Quitting will reduce your risk of having a heart attack, stroke, cancer, or lung disease:   After 1 year, the risk of coronary heart disease is cut in half.   After 5 years, the risk of stroke falls to the same as a nonsmoker.   After 10 years, the risk of lung cancer is cut in half and the risk of other cancers decreases significantly.   After 15 years, the risk of coronary heart  disease drops, usually to the level of a nonsmoker.   If you are pregnant, quitting smoking will improve your chances of having a healthy baby.   The people you live with, especially your children, will be healthier.   You will have extra money to spend on things other than cigarettes.  FIVE KEYS TO QUITTING Studies have shown that these 5 steps will help you quit smoking and quit for good. You have the best chances of quitting if you use them together: 1. Get ready.  2. Get support and encouragement.  3. Learn new skills and behaviors.  4. Get medicine to reduce your nicotine addiction and use it correctly.  5. Be prepared for relapse or difficult situations. Be determined to continue trying to quit, even if you do not succeed at first.  1. GET READY  Set a quit date.   Change your environment.   Get rid of ALL cigarettes, ashtrays, matches, and lighters in your home, car, and place of work.   Do not let people smoke in your home.   Review your past attempts to quit. Think about what worked and what did not.   Once you quit, do not smoke. NOT EVEN A PUFF!  2. GET SUPPORT AND ENCOURAGEMENT Studies have shown that you have a better chance of being successful if you have help. You can get support in many ways.  Tell   your family, friends, and coworkers that you are going to quit and need their support. Ask them not to smoke around you.   Talk to your caregivers (doctor, dentist, nurse, pharmacist, psychologist, and/or smoking counselor).   Get individual, group, or telephone counseling and support. The more counseling you have, the better your chances are of quitting. Programs are available at local hospitals and health centers. Call your local health department for information about programs in your area.   Spiritual beliefs and practices may help some smokers quit.   Quit meters are small computer programs online or downloadable that keep track of quit statistics, such as amount  of "quit-time," cigarettes not smoked, and money saved.   Many smokers find one or more of the many self-help books available useful in helping them quit and stay off tobacco.  3. LEARN NEW SKILLS AND BEHAVIORS  Try to distract yourself from urges to smoke. Talk to someone, go for a walk, or occupy your time with a task.   When you first try to quit, change your routine. Take a different route to work. Drink tea instead of coffee. Eat breakfast in a different place.   Do something to reduce your stress. Take a hot bath, exercise, or read a book.   Plan something enjoyable to do every day. Reward yourself for not smoking.   Explore interactive web-based programs that specialize in helping you quit.  4. GET MEDICINE AND USE IT CORRECTLY Medicines can help you stop smoking and decrease the urge to smoke. Combining medicine with the above behavioral methods and support can quadruple your chances of successfully quitting smoking. The [place] Food and Drug Administration (FDA) has approved 7 medicines to help you quit smoking. These medicines fall into 3 categories.  Nicotine replacement therapy (delivers nicotine to your body without the negative effects and risks of smoking):   Nicotine gum: Available over-the-counter.   Nicotine lozenges: Available over-the-counter.   Nicotine inhaler: Available by prescription.   Nicotine nasal spray: Available by prescription.   Nicotine skin patches (transdermal): Available by prescription and over-the-counter.   Antidepressant medicine (helps people abstain from smoking, but how this works is unknown):   Bupropion sustained-release (SR) tablets: Available by prescription.   Nicotinic receptor partial agonist (simulates the effect of nicotine in your brain):   Varenicline tartrate tablets: Available by prescription.   Ask your caregiver for advice about which medicines to use and how to use them. Carefully read the information on the package.    Everyone who is trying to quit may benefit from using a medicine. If you are pregnant or trying to become pregnant, nursing an infant, you are under age 18, or you smoke fewer than 10 cigarettes per day, talk to your caregiver before taking any nicotine replacement medicines.   You should stop using a nicotine replacement product and call your caregiver if you experience nausea, dizziness, weakness, vomiting, fast or irregular heartbeat, mouth problems with the lozenge or gum, or redness or swelling of the skin around the patch that does not go away.   Do not use any other product containing nicotine while using a nicotine replacement product.   Talk to your caregiver before using these products if you have diabetes, heart disease, asthma, stomach ulcers, you had a recent heart attack, you have high blood pressure that is not controlled with medicine, a history of irregular heartbeat, or you have been prescribed medicine to help you quit smoking.  5. BE PREPARED FOR RELAPSE OR   DIFFICULT SITUATIONS  Most relapses occur within the first 3 months after quitting. Do not be discouraged if you start smoking again. Remember, most people try several times before they finally quit.   You may have symptoms of withdrawal because your body is used to nicotine. You may crave cigarettes, be irritable, feel very hungry, cough often, get headaches, or have difficulty concentrating.   The withdrawal symptoms are only temporary. They are strongest when you first quit, but they will go away within 10 to 14 days.  Here are some difficult situations to watch for:  Alcohol. Avoid drinking alcohol. Drinking lowers your chances of successfully quitting.   Caffeine. Try to reduce the amount of caffeine you consume. It also lowers your chances of successfully quitting.   Other smokers. Being around smoking can make you want to smoke. Avoid smokers.   Weight gain. Many smokers will gain weight when they quit, usually  less than 10 pounds. Eat a healthy diet and stay active. Do not let weight gain distract you from your main goal, quitting smoking. Some medicines that help you quit smoking may also help delay weight gain. You can always lose the weight gained after you quit.   Bad mood or depression. There are a lot of ways to improve your mood other than smoking.  If you are having problems with any of these situations, talk to your caregiver. SPECIAL SITUATIONS AND CONDITIONS Studies suggest that everyone can quit smoking. Your situation or condition can give you a special reason to quit.  Pregnant women/new mothers: By quitting, you protect your baby's health and your own.   Hospitalized patients: By quitting, you reduce health problems and help healing.   Heart attack patients: By quitting, you reduce your risk of a second heart attack.   Lung, head, and neck cancer patients: By quitting, you reduce your chance of a second cancer.   Parents of children and adolescents: By quitting, you protect your children from illnesses caused by secondhand smoke.  QUESTIONS TO THINK ABOUT Think about the following questions before you try to stop smoking. You may want to talk about your answers with your caregiver.  Why do you want to quit?   If you tried to quit in the past, what helped and what did not?   What will be the most difficult situations for you after you quit? How will you plan to handle them?   Who can help you through the tough times? Your family? Friends? Caregiver?   What pleasures do you get from smoking? What ways can you still get pleasure if you quit?  Here are some questions to ask your caregiver:  How can you help me to be successful at quitting?   What medicine do you think would be best for me and how should I take it?   What should I do if I need more help?   What is smoking withdrawal like? How can I get information on withdrawal?  Quitting takes hard work and a lot of effort,  but you can quit smoking. FOR MORE INFORMATION  Smokefree.gov (http://www.davis-sullivan.com/) provides free, accurate, evidence-based information and professional assistance to help support the immediate and long-term needs of people trying to quit smoking. Document Released: 03/01/2001 Document Revised: 02/24/2011 Document Reviewed: 12/22/2008 [facility] Patient Information 2012 [place], [place].  Reduce alcohol to no more than 3 ounces of whiskey per day Work on weight loss Reduce sugars and starches (eg. Reduce sweetened tea) We will call you regarding colonoscopy.

## 2011-12-14 NOTE — Progress Notes (Addendum)
Subjective:    Patient ID: Gabriel Miller, male    DOB: 1960/06/09, 51 y.o.   MRN: [id number]  HPI  Patient here for complete physical.  He has history of COPD, ongoing nicotine use, hypertension, stage II chronic kidney disease, bipolar disorder. Regular followup with psychiatry. He is on several medications from them. He continues to smoke about one pack of cigarettes per day. He does express desire in quitting. He has tried nicotine gum and patches in the past without much success. No regular exercise. Drinks liquor about a fifth every weekend.  He had Pneumovax back in June of this year. Last tetanus 2004. No flu vaccine yet. No history of screening colonoscopy.  Patient has separate issue of left lower lumbar back pain. Present for couple months. No radiculopathy symptoms. Dull to sharp. Moderate severity. Symptoms worse with movement. No numbness or weakness. Pain is worse at night. He tried over-the-counter analgesics without much improvement No associated rash, urinary sxs, appetite or weight change.  Past Medical History  Diagnosis Date  . Alcohol abuse   . Depression   . GERD (gastroesophageal reflux disease)   . Hypertension    Past Surgical History  Procedure Date  . Middle ear surgery 1990    reports that he has been smoking Cigarettes.  He has a 220 pack-year smoking history. He does not have any smokeless tobacco history on file. He reports that he drinks alcohol. His drug history not on file. family history includes Alcohol abuse in his father; Arthritis in his mother; COPD in his mother; Cancer in his maternal aunt and maternal grandmother; Diabetes in his father, maternal grandfather, maternal grandmother, mother, and paternal grandfather; Heart attack in his father; Heart disease in his mother; and Hypertension in his father, maternal aunt, and mother. No Known Allergies    Review of Systems  Constitutional: Negative for fever, activity change, appetite change, fatigue  and unexpected weight change.  HENT: Negative for ear pain, congestion, trouble swallowing and voice change.   Eyes: Negative for pain and visual disturbance.  Respiratory: Negative for cough, shortness of breath and wheezing.   Cardiovascular: Negative for chest pain and palpitations.  Gastrointestinal: Negative for nausea, vomiting, abdominal pain, diarrhea, constipation, blood in stool, abdominal distention and rectal pain.  Genitourinary: Negative for dysuria, hematuria and testicular pain.  Musculoskeletal: Positive for back pain. Negative for joint swelling and arthralgias.  Skin: Negative for rash.  Neurological: Negative for dizziness, syncope and headaches.  Hematological: Negative for adenopathy.  Psychiatric/Behavioral: Negative for confusion and dysphoric mood.       Objective:   Physical Exam  Constitutional: He is oriented to person, place, and time. He appears well-developed and well-nourished.  HENT:  Right Ear: External ear normal.       Scarring of left TM from prior surgeries  Eyes: Pupils are equal, round, and reactive to light.  Neck: Neck supple. No thyromegaly present.       No carotid bruit  Cardiovascular: Normal rate and regular rhythm.   Pulmonary/Chest:       Patient has slightly diminished breath sounds throughout. Faint wheezes bilaterally. No rales.  Abdominal: Soft. He exhibits no distension and no mass. There is no tenderness. There is no rebound and no guarding.  Genitourinary: Rectum normal and prostate normal.  Musculoskeletal: He exhibits no edema.       Straight leg raise are negative. He has some tenderness left lower lumbar region. No spinal tenderness. Symmetric lower extremity reflexes. No focal strength deficits  Normal distal foot pulses.  Lymphadenopathy:    He has no cervical adenopathy.  Neurological: He is alert and oriented to person, place, and time. No cranial nerve deficit.  Skin: No rash noted.  Psychiatric:       Patient  somewhat flat affect but appropriate communications and interaction          Assessment & Plan:  Health maintenance. Smoking cessation discussed. Would not feel comfortable using Chantix without approval from his psychiatrist. Information on smoking cessation given. Tetanus booster given. Flu vaccine given. Pneumovax up-to-date. Schedule screening colonoscopy. Add lipids to recent labs.  Left lumbar back pain. Suspect musculoskeletal. Avoid long-term nonsteroidals with his multiple other risk including chronic kidney disease. Avoid chronic opioid use. Try Lidoderm patch. Check urinalysis. Recent hip films unremarkable  Very high triglycerides.   Will start Fenofibrate 160 mg once daily and repeat lipids in 6-8 weeks.

## 2011-12-15 ENCOUNTER — Other Ambulatory Visit: Payer: Self-pay | Admitting: *Deleted

## 2011-12-15 DIAGNOSIS — E785 Hyperlipidemia, unspecified: Secondary | ICD-10-CM

## 2011-12-15 MED ORDER — FENOFIBRATE 160 MG PO TABS: 160.0000 mg | ORAL_TABLET | Freq: Every day | ORAL | Status: DC

## 2011-12-15 NOTE — Progress Notes (Signed)
Quick Note:  Pt mother (DPR) informed ______

## 2011-12-22 ENCOUNTER — Ambulatory Visit: Payer: Medicare Other | Admitting: Family Medicine

## 2012-03-07 ENCOUNTER — Ambulatory Visit: Payer: Medicare Other | Admitting: Family Medicine

## 2012-08-30 ENCOUNTER — Other Ambulatory Visit: Payer: Self-pay | Admitting: Family Medicine

## 2013-01-04 ENCOUNTER — Ambulatory Visit (INDEPENDENT_AMBULATORY_CARE_PROVIDER_SITE_OTHER): Payer: Medicare Other

## 2013-01-04 DIAGNOSIS — Z23 Encounter for immunization: Secondary | ICD-10-CM

## 2013-02-16 ENCOUNTER — Other Ambulatory Visit: Payer: Self-pay | Admitting: Family Medicine

## 2013-04-18 ENCOUNTER — Encounter: Payer: Self-pay | Admitting: Family Medicine

## 2013-04-18 ENCOUNTER — Telehealth: Payer: Self-pay | Admitting: Family Medicine

## 2013-04-18 ENCOUNTER — Other Ambulatory Visit: Payer: Self-pay | Admitting: Family Medicine

## 2013-04-18 ENCOUNTER — Ambulatory Visit (INDEPENDENT_AMBULATORY_CARE_PROVIDER_SITE_OTHER): Payer: Medicare HMO | Admitting: Family Medicine

## 2013-04-18 VITALS — BP 148/84 | HR 85 | Temp 98.0°F | Wt 213.0 lb

## 2013-04-18 DIAGNOSIS — N529 Male erectile dysfunction, unspecified: Secondary | ICD-10-CM | POA: Insufficient documentation

## 2013-04-18 DIAGNOSIS — E669 Obesity, unspecified: Secondary | ICD-10-CM | POA: Insufficient documentation

## 2013-04-18 DIAGNOSIS — N182 Chronic kidney disease, stage 2 (mild): Secondary | ICD-10-CM

## 2013-04-18 DIAGNOSIS — I10 Essential (primary) hypertension: Secondary | ICD-10-CM

## 2013-04-18 DIAGNOSIS — R635 Abnormal weight gain: Secondary | ICD-10-CM

## 2013-04-18 DIAGNOSIS — E785 Hyperlipidemia, unspecified: Secondary | ICD-10-CM

## 2013-04-18 LAB — HEPATIC FUNCTION PANEL
ALBUMIN: 3.9 g/dL (ref 3.5–5.2)
ALK PHOS: 72 U/L (ref 39–117)
ALT: 58 U/L — ABNORMAL HIGH (ref 0–53)
AST: 33 U/L (ref 0–37)
Bilirubin, Direct: 0 mg/dL (ref 0.0–0.3)
Total Bilirubin: 0.6 mg/dL (ref 0.3–1.2)
Total Protein: 7 g/dL (ref 6.0–8.3)

## 2013-04-18 LAB — TSH: TSH: 0.41 u[IU]/mL (ref 0.35–5.50)

## 2013-04-18 LAB — BASIC METABOLIC PANEL
BUN: 10 mg/dL (ref 6–23)
CO2: 28 meq/L (ref 19–32)
Calcium: 9.1 mg/dL (ref 8.4–10.5)
Chloride: 103 mEq/L (ref 96–112)
Creatinine, Ser: 1.4 mg/dL (ref 0.4–1.5)
GFR: 55.7 mL/min — ABNORMAL LOW (ref >60.00)
GLUCOSE: 103 mg/dL — AB (ref 70–99)
POTASSIUM: 3.6 meq/L (ref 3.5–5.1)
SODIUM: 140 meq/L (ref 135–145)

## 2013-04-18 LAB — LIPID PANEL
CHOL/HDL RATIO: 7
Cholesterol: 222 mg/dL — ABNORMAL HIGH (ref 0–200)
HDL: 31.2 mg/dL — AB (ref >39.00)
Triglycerides: 537 mg/dL — ABNORMAL HIGH (ref 0.0–149.0)
VLDL: 107.4 mg/dL — AB (ref 0.0–40.0)

## 2013-04-18 LAB — LDL CHOLESTEROL, DIRECT: LDL DIRECT: 119.8 mg/dL

## 2013-04-18 MED ORDER — TIOTROPIUM BROMIDE MONOHYDRATE 18 MCG IN CAPS: 18.0000 ug | ORAL_CAPSULE | Freq: Every day | RESPIRATORY_TRACT | Status: AC

## 2013-04-18 MED ORDER — SILDENAFIL CITRATE 100 MG PO TABS: 50.0000 mg | ORAL_TABLET | Freq: Every day | ORAL | Status: AC | PRN

## 2013-04-18 MED ORDER — LOSARTAN POTASSIUM-HCTZ 100-12.5 MG PO TABS: 1.0000 | ORAL_TABLET | Freq: Every day | ORAL | Status: DC

## 2013-04-18 NOTE — Progress Notes (Signed)
Pre visit review using our clinic review tool, if applicable. No additional management support is needed unless otherwise documented below in the visit note. 

## 2013-04-18 NOTE — Telephone Encounter (Signed)
Pt mother is calling requesting to speak with Blane OharaMontrice states she has questions regarding her sons meds.

## 2013-04-18 NOTE — Telephone Encounter (Signed)
Pt son has appt.

## 2013-04-18 NOTE — Progress Notes (Signed)
   Subjective:    Patient ID: Gabriel Miller, male    DOB: 07/27/1960, 53 y.o.   MRN: [id number]  HPI Patient seen for several issues as follows Has history of poor compliance and has mental health issues with bipolar and paranoia followed by mental health, hypertension, dyslipidemia, COPD, chronic kidney disease. We've not seen him in over one year. He is followed by mental health center and had some recent elevated blood pressures.  Has been on losartan 50 mg daily but ran out over one week ago. Even when taking this had several blood pressures run 150/90. Has some chronic dyspnea. He quit smoking about one month ago. He has previously taken Spiriva but ran out and is currently not on any inhalers.  He complains of erectile dysfunction. No nitroglycerin use. No cardiac use. No recent lab work.  Past Medical History  Diagnosis Date  . Alcohol abuse   . Depression   . GERD (gastroesophageal reflux disease)   . Hypertension    Past Surgical History  Procedure Laterality Date  . Middle ear surgery  1990    reports that he has been smoking Cigarettes.  He has a 220 pack-year smoking history. He does not have any smokeless tobacco history on file. He reports that he drinks alcohol. His drug history is not on file. family history includes Alcohol abuse in his father; Arthritis in his mother; COPD in his mother; Cancer in his maternal aunt and maternal grandmother; Diabetes in his father, maternal grandfather, maternal grandmother, mother, and paternal grandfather; Heart attack in his father; Heart disease in his mother; Hypertension in his father, maternal aunt, and mother. No Known Allergies    Review of Systems  Constitutional: Negative for fever, chills, appetite change and unexpected weight change.  Respiratory: Positive for shortness of breath. Negative for cough and wheezing.   Cardiovascular: Negative for chest pain, palpitations and leg swelling.  Gastrointestinal: Negative for  abdominal pain.  Endocrine: Negative for polydipsia and polyuria.  Genitourinary: Negative for dysuria.  Neurological: Negative for dizziness.       Objective:   Physical Exam  Constitutional: He is oriented to person, place, and time. He appears well-developed and well-nourished.  HENT:  Right Ear: External ear normal.  Left Ear: External ear normal.  Mouth/Throat: Oropharynx is clear and moist.  Eyes: Pupils are equal, round, and reactive to light.  Neck: Neck supple. No thyromegaly present.  Cardiovascular: Normal rate and regular rhythm.   Pulmonary/Chest: Effort normal and breath sounds normal. No respiratory distress. He has no wheezes. He has no rales.  Musculoskeletal: He exhibits no edema.  Neurological: He is alert and oriented to person, place, and time.          Assessment & Plan:  #1 hypertension. Poorly controlled. Change losartan to losartan HCTZ 100/12.5 one daily and reassess blood pressure one month. Also needs to lose some weight #2 dyslipidemia. Recheck lipid and hepatic panel #3 recent weight gain probably secondary to sedentary lifestyle and poor compliance. Check TSH #4 chronic kidney disease. Repeat basic metabolic panel #5 erectile dysfunction. Viagra 100 mg one half to one tablet daily as needed. He does not have any history of nitroglycerin use

## 2013-04-19 ENCOUNTER — Other Ambulatory Visit: Payer: Self-pay

## 2013-04-19 ENCOUNTER — Other Ambulatory Visit: Payer: Self-pay | Admitting: Family Medicine

## 2013-04-19 DIAGNOSIS — E785 Hyperlipidemia, unspecified: Secondary | ICD-10-CM

## 2013-04-19 MED ORDER — FENOFIBRATE 160 MG PO TABS: 160.0000 mg | ORAL_TABLET | Freq: Every day | ORAL | Status: DC

## 2013-05-16 ENCOUNTER — Ambulatory Visit: Payer: Medicare HMO | Admitting: Family Medicine

## 2013-07-31 ENCOUNTER — Ambulatory Visit (INDEPENDENT_AMBULATORY_CARE_PROVIDER_SITE_OTHER): Payer: Medicare HMO | Admitting: Psychiatry

## 2013-07-31 VITALS — BP 101/70 | HR 92 | Ht 69.0 in | Wt 207.6 lb

## 2013-07-31 DIAGNOSIS — F4310 Post-traumatic stress disorder, unspecified: Secondary | ICD-10-CM

## 2013-07-31 MED ORDER — TRAZODONE HCL 50 MG PO TABS: 50.0000 mg | ORAL_TABLET | Freq: Every evening | ORAL | Status: DC | PRN

## 2013-07-31 MED ORDER — FLUOXETINE HCL 20 MG PO CAPS: ORAL_CAPSULE | ORAL | Status: DC

## 2013-07-31 MED ORDER — HALOPERIDOL 5 MG PO TABS: 5.0000 mg | ORAL_TABLET | Freq: Every day | ORAL | Status: DC

## 2013-07-31 MED ORDER — GABAPENTIN 400 MG PO CAPS: 400.0000 mg | ORAL_CAPSULE | Freq: Three times a day (TID) | ORAL | Status: DC

## 2013-07-31 MED ORDER — LOSARTAN POTASSIUM-HCTZ 100-12.5 MG PO TABS: 1.0000 | ORAL_TABLET | Freq: Every day | ORAL | Status: DC
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
## 2013-08-02 ENCOUNTER — Encounter: Payer: Self-pay | Admitting: Family Medicine

## 2013-08-02 ENCOUNTER — Ambulatory Visit (INDEPENDENT_AMBULATORY_CARE_PROVIDER_SITE_OTHER): Payer: Medicare HMO | Admitting: Family Medicine

## 2013-08-02 VITALS — BP 140/90 | HR 97 | Temp 98.6°F | Resp 20 | Ht 69.0 in | Wt 210.0 lb

## 2013-08-02 DIAGNOSIS — J449 Chronic obstructive pulmonary disease, unspecified: Secondary | ICD-10-CM

## 2013-08-02 DIAGNOSIS — E785 Hyperlipidemia, unspecified: Secondary | ICD-10-CM

## 2013-08-02 NOTE — Progress Notes (Signed)
Pre-visit discussion using our clinic review tool. No additional management support is needed unless otherwise documented below in the visit note.  

## 2013-08-02 NOTE — Progress Notes (Signed)
   Subjective:    Patient ID: Gabriel DikeRobert Miller, male    DOB: 03/26/1960, 53 y.o.   MRN: [id number]  HPI Patient is here [facility] have [facility] He's had these completed previously. Has chronic problems include history of dyslipidemia, bipolar disorder, reported posttraumatic stress disorder, hypertension, COPD, and obesity. His COPD is relatively mild. Pulse oximetry 98% room air. He has previously been on inhalers including Spiriva and Advair but stopped these on his own. History of poor compliance.  No cardiac history. No history of diabetes. No history of seizures. No history of known substance abuse. He is followed by psychiatry regarding his other issues. Recently changed care to a different psychiatrist.  Dyslipidemia. Recent triglycerides over 500. We started fenofibrate. He thinks he is taking this regularly. Is overdue for repeat lipids. Nonfasting today.  Past Medical History  Diagnosis Date  . Alcohol abuse   . Depression   . GERD (gastroesophageal reflux disease)   . Hypertension    Past Surgical History  Procedure Laterality Date  . Middle ear surgery  1990    reports that he has been smoking Cigarettes.  He has a 220 pack-year smoking history. He does not have any smokeless tobacco history on file. He reports that he drinks alcohol. His drug history is not on file. family history includes Alcohol abuse in his father; Arthritis in his mother; COPD in his mother; Cancer in his maternal aunt and maternal grandmother; Diabetes in his father, maternal grandfather, maternal grandmother, mother, and paternal grandfather; Heart attack in his father; Heart disease in his mother; Hypertension in his father, maternal aunt, and mother. No Known Allergies    Review of Systems  Constitutional: Negative for fatigue.  Eyes: Negative for visual disturbance.  Respiratory: Negative for cough, chest tightness and shortness of breath.   Cardiovascular: Negative for chest pain,  palpitations and leg swelling.  Neurological: Negative for dizziness, syncope, weakness, light-headedness and headaches.       Objective:   Physical Exam  Constitutional: He is oriented to person, place, and time. He appears well-developed and well-nourished.  Cardiovascular: Normal rate and regular rhythm.   Pulmonary/Chest: Effort normal and breath sounds normal. No respiratory distress. He has no wheezes. He has no rales.  Musculoskeletal: He exhibits no edema.  Neurological: He is alert and oriented to person, place, and time. No cranial nerve deficit.          Assessment & Plan:  #1 history of COPD. Ex-smoker. Poor compliance with medications. Symptomatically stable. Pulse oximetry room air 98%. Encouraged to get back on Spiriva. #2 dyslipidemia. Schedule followup and get fasting lipids at followup #3 history bipolar disorder. He will present to psychiatrist to have those sections of his DMV form completed

## 2013-08-04 DIAGNOSIS — J449 Chronic obstructive pulmonary disease, unspecified: Secondary | ICD-10-CM | POA: Insufficient documentation

## 2013-08-19 ENCOUNTER — Telehealth (HOSPITAL_COMMUNITY): Payer: Self-pay | Admitting: *Deleted

## 2013-08-19 NOTE — Telephone Encounter (Signed)
Mother left WP:VXYIA form for DMV filled out and returned as soon as possible. Contacted mother -pt came on line, gave verbal permission to speak with mother. Mother states the form needs to be turned in by 09/30/13. Informed her MD has only seen pt once on 5/13 and did not ask some of the questions needed to obtain information to fill out form. Dr. Donell Beers intended to fill out form at appt on 10/04/13. Mother states he really has to have it completed before then, so she will contact office to see if earlier appt can be scheduled, so that form can be completed and returned to [facility] before 09/30/13.Marland Kitchen

## 2013-08-28 ENCOUNTER — Ambulatory Visit (INDEPENDENT_AMBULATORY_CARE_PROVIDER_SITE_OTHER): Payer: Medicare HMO | Admitting: Psychiatry

## 2013-08-28 VITALS — BP 136/106 | HR 84 | Ht 69.0 in | Wt 210.4 lb

## 2013-08-28 DIAGNOSIS — F1994 Other psychoactive substance use, unspecified with psychoactive substance-induced mood disorder: Secondary | ICD-10-CM

## 2013-08-28 MED ORDER — GABAPENTIN 400 MG PO CAPS: 400.0000 mg | ORAL_CAPSULE | Freq: Three times a day (TID) | ORAL | Status: DC

## 2013-08-28 MED ORDER — TRAZODONE HCL 50 MG PO TABS: 50.0000 mg | ORAL_TABLET | Freq: Every evening | ORAL | Status: DC | PRN

## 2013-08-28 MED ORDER — HALOPERIDOL 5 MG PO TABS: 5.0000 mg | ORAL_TABLET | Freq: Every day | ORAL | Status: DC

## 2013-08-28 MED ORDER — FLUOXETINE HCL 20 MG PO CAPS: ORAL_CAPSULE | ORAL | Status: DC
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
## 2013-10-04 ENCOUNTER — Ambulatory Visit (HOSPITAL_COMMUNITY): Payer: Self-pay | Admitting: Psychiatry

## 2013-12-13 ENCOUNTER — Ambulatory Visit (INDEPENDENT_AMBULATORY_CARE_PROVIDER_SITE_OTHER): Payer: Medicare HMO

## 2013-12-13 DIAGNOSIS — Z23 Encounter for immunization: Secondary | ICD-10-CM

## 2014-01-29 ENCOUNTER — Ambulatory Visit (INDEPENDENT_AMBULATORY_CARE_PROVIDER_SITE_OTHER): Payer: Medicare HMO | Admitting: Psychiatry

## 2014-01-29 VITALS — BP 138/100 | HR 76 | Ht 69.0 in | Wt 207.2 lb

## 2014-01-29 DIAGNOSIS — F323 Major depressive disorder, single episode, severe with psychotic features: Secondary | ICD-10-CM

## 2014-01-29 MED ORDER — TRAZODONE HCL 50 MG PO TABS
50.0000 mg | ORAL_TABLET | Freq: Every evening | ORAL | Status: DC | PRN
Start: 2014-01-29 — End: 2014-07-02

## 2014-01-29 MED ORDER — HALOPERIDOL 5 MG PO TABS: 5.0000 mg | ORAL_TABLET | Freq: Every day | ORAL | Status: DC

## 2014-01-29 MED ORDER — GABAPENTIN 400 MG PO CAPS: 400.0000 mg | ORAL_CAPSULE | Freq: Three times a day (TID) | ORAL | Status: DC

## 2014-01-29 MED ORDER — FLUOXETINE HCL 20 MG PO CAPS: ORAL_CAPSULE | ORAL | Status: DC
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
## 2014-04-15 ENCOUNTER — Other Ambulatory Visit (HOSPITAL_COMMUNITY): Payer: Self-pay | Admitting: Psychiatry

## 2014-04-25 ENCOUNTER — Telehealth: Payer: Self-pay | Admitting: Family Medicine

## 2014-04-25 ENCOUNTER — Other Ambulatory Visit: Payer: Self-pay

## 2014-04-25 MED ORDER — LOSARTAN POTASSIUM-HCTZ 100-12.5 MG PO TABS: 1.0000 | ORAL_TABLET | Freq: Every day | ORAL | Status: DC

## 2014-04-25 NOTE — Telephone Encounter (Signed)
Denied refill of patient's losartan-hydrochlorothiazide (HYZAAR) 100-12.5 MG per tablet as this is not a medication Dr.Plovsky prescribes for patient.  Informed patient's pharmacy through e-scribe.

## 2014-04-25 NOTE — Telephone Encounter (Signed)
RX sent to pharmacy  

## 2014-04-25 NOTE — Telephone Encounter (Signed)
Pt request refill of the following: losartan-hydrochlorothiazide (HYZAAR) 100-12.5 MG per tablet   Phamacy:

## 2014-07-02 ENCOUNTER — Ambulatory Visit (INDEPENDENT_AMBULATORY_CARE_PROVIDER_SITE_OTHER): Payer: Medicare HMO | Admitting: Psychiatry

## 2014-07-02 VITALS — BP 119/88 | HR 89 | Ht 69.0 in | Wt 208.0 lb

## 2014-07-02 DIAGNOSIS — F209 Schizophrenia, unspecified: Secondary | ICD-10-CM

## 2014-07-02 DIAGNOSIS — F323 Major depressive disorder, single episode, severe with psychotic features: Secondary | ICD-10-CM

## 2014-07-02 MED ORDER — HALOPERIDOL 5 MG PO TABS: 5.0000 mg | ORAL_TABLET | Freq: Every day | ORAL | Status: DC

## 2014-07-02 MED ORDER — FLUOXETINE HCL 20 MG PO CAPS: ORAL_CAPSULE | ORAL | Status: DC

## 2014-07-02 MED ORDER — TRAZODONE HCL 100 MG PO TABS: 100.0000 mg | ORAL_TABLET | Freq: Every evening | ORAL | Status: DC | PRN
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
## 2014-09-23 ENCOUNTER — Other Ambulatory Visit: Payer: Self-pay | Admitting: Family Medicine

## 2014-09-23 ENCOUNTER — Other Ambulatory Visit (HOSPITAL_COMMUNITY): Payer: Self-pay | Admitting: Psychiatry

## 2014-09-23 DIAGNOSIS — F323 Major depressive disorder, single episode, severe with psychotic features: Secondary | ICD-10-CM

## 2014-09-25 MED ORDER — GABAPENTIN 400 MG PO CAPS: 400.0000 mg | ORAL_CAPSULE | Freq: Three times a day (TID) | ORAL | Status: DC

## 2014-09-25 NOTE — Telephone Encounter (Signed)
Called patient's [facility] pharmacy back to verify he does not need a refill of his prescribed Trazodone as he had an order e-scribed in on 07/02/14 plus 6 refills; however, patient is in need of a refill of Gabapentin.  Dr. Donell BeersPlovsky is out this week so Dr. Rutherford Limerickadepalli approved refill of patient's Gabapentin plus one refill to last until patient returns to see Dr. Donell BeersPlovsky on 12/03/14.  New Gabapentin order e-scribed this date.

## 2014-10-02 ENCOUNTER — Encounter: Payer: Self-pay | Admitting: Family Medicine

## 2014-10-02 ENCOUNTER — Ambulatory Visit (INDEPENDENT_AMBULATORY_CARE_PROVIDER_SITE_OTHER): Payer: Medicare HMO | Admitting: Family Medicine

## 2014-10-02 VITALS — BP 128/78 | HR 109 | Temp 98.1°F | Ht 69.0 in | Wt 209.9 lb

## 2014-10-02 DIAGNOSIS — N182 Chronic kidney disease, stage 2 (mild): Secondary | ICD-10-CM

## 2014-10-02 DIAGNOSIS — E785 Hyperlipidemia, unspecified: Secondary | ICD-10-CM | POA: Diagnosis not present

## 2014-10-02 DIAGNOSIS — F3130 Bipolar disorder, current episode depressed, mild or moderate severity, unspecified: Secondary | ICD-10-CM

## 2014-10-02 DIAGNOSIS — I10 Essential (primary) hypertension: Secondary | ICD-10-CM | POA: Diagnosis not present

## 2014-10-02 NOTE — Progress Notes (Signed)
   Subjective:    Patient ID: Gabriel LinkRobert C Fini, male    DOB: 11/17/1960, 54 y.o.   MRN: [id number]  HPI Patient seen requesting letter. He has history of bipolar disorder and is followed regularly by psychiatry. He apparently has a brother who is a DURABLE POWER OF ATTORNEY and he has been on disability for several years and is basically requesting a letter stating that he (patient) is capable of managing his own financial affairs. We have recommended that he get this letter from his psychiatrist since he has a regular ongoing relationship with them  Other medical problems include history of COPD, hypertension, hyperlipidemia. He smokes about half pack cigarettes per day. He is not using his inhalers regularly. He states his respiratory status stable. He states he is taking losartan HCTZ and fenofibrate regularly. No labs in over one year. Not fasting today. Denies any chest pains. No dizziness. Also has history of chronic kidney disease stage II  Past Medical History  Diagnosis Date  . Alcohol abuse   . Depression   . GERD (gastroesophageal reflux disease)   . Hypertension    Past Surgical History  Procedure Laterality Date  . Middle ear surgery  1990    reports that he has been smoking Cigarettes.  He has a 220 pack-year smoking history. He does not have any smokeless tobacco history on file. He reports that he drinks alcohol. His drug history is not on file. family history includes Alcohol abuse in his father; Arthritis in his mother; COPD in his mother; Cancer in his maternal aunt and maternal grandmother; Diabetes in his father, maternal grandfather, maternal grandmother, mother, and paternal grandfather; Heart attack in his father; Heart disease in his mother; Hypertension in his father, maternal aunt, and mother. No Known Allergies    Review of Systems  Constitutional: Negative for fatigue.  Eyes: Negative for visual disturbance.  Respiratory: Negative for cough, chest tightness and  shortness of breath.   Cardiovascular: Negative for chest pain, palpitations and leg swelling.  Endocrine: Negative for polydipsia and polyuria.  Genitourinary: Negative for dysuria.  Neurological: Negative for dizziness, syncope, weakness, light-headedness and headaches.       Objective:   Physical Exam  Constitutional: He is oriented to person, place, and time. He appears well-developed and well-nourished.  HENT:  Right Ear: External ear normal.  Left Ear: External ear normal.  Mouth/Throat: Oropharynx is clear and moist.  Eyes: Pupils are equal, round, and reactive to light.  Neck: Neck supple. No thyromegaly present.  Cardiovascular: Normal rate and regular rhythm.   Pulmonary/Chest: Effort normal. No respiratory distress. He has no rales.  Slightly diminished breath sounds throughout  Musculoskeletal: He exhibits no edema.  Neurological: He is alert and oriented to person, place, and time.          Assessment & Plan:  #1 history of bipolar disorder. Patient requesting letter as above. We explained this would be more appropriate coming from his psychiatrist and he agrees #2 hypertension stable by today's reading. Continue losartan HCTZ. He will return for basic metabolic panel next week #3 dyslipidemia. Fasting lipid panel ordered #4 COPD. Poor compliance with inhalers. Recommend yearly flu vaccine. He is strongly encouraged to stop smoking but low motivation

## 2014-10-02 NOTE — Progress Notes (Signed)
Pre visit review using our clinic review tool, if applicable. No additional management support is needed unless otherwise documented below in the visit note. 

## 2014-10-08 ENCOUNTER — Other Ambulatory Visit: Payer: Self-pay

## 2014-10-26 ENCOUNTER — Other Ambulatory Visit: Payer: Self-pay | Admitting: Family Medicine

## 2014-10-26 ENCOUNTER — Other Ambulatory Visit (HOSPITAL_COMMUNITY): Payer: Self-pay | Admitting: Psychiatry

## 2014-11-05 ENCOUNTER — Other Ambulatory Visit: Payer: Self-pay | Admitting: Family Medicine

## 2014-11-05 ENCOUNTER — Other Ambulatory Visit (HOSPITAL_COMMUNITY): Payer: Self-pay | Admitting: Psychiatry

## 2014-12-03 ENCOUNTER — Ambulatory Visit (INDEPENDENT_AMBULATORY_CARE_PROVIDER_SITE_OTHER): Payer: Medicare HMO | Admitting: Psychiatry

## 2014-12-03 VITALS — BP 108/76 | HR 93 | Ht 69.0 in | Wt 200.0 lb

## 2014-12-03 DIAGNOSIS — F331 Major depressive disorder, recurrent, moderate: Secondary | ICD-10-CM

## 2014-12-03 DIAGNOSIS — F323 Major depressive disorder, single episode, severe with psychotic features: Secondary | ICD-10-CM

## 2014-12-03 MED ORDER — FLUOXETINE HCL 20 MG PO CAPS: ORAL_CAPSULE | ORAL | Status: DC

## 2014-12-03 MED ORDER — TRAZODONE HCL 100 MG PO TABS: 100.0000 mg | ORAL_TABLET | Freq: Every evening | ORAL | Status: DC | PRN

## 2014-12-03 MED ORDER — HALOPERIDOL 5 MG PO TABS: 5.0000 mg | ORAL_TABLET | Freq: Every day | ORAL | Status: DC

## 2014-12-03 MED ORDER — GABAPENTIN 400 MG PO CAPS: 400.0000 mg | ORAL_CAPSULE | Freq: Three times a day (TID) | ORAL | Status: DC
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
## 2015-01-16 ENCOUNTER — Other Ambulatory Visit (HOSPITAL_COMMUNITY): Payer: Self-pay | Admitting: Psychiatry

## 2015-01-28 ENCOUNTER — Ambulatory Visit (INDEPENDENT_AMBULATORY_CARE_PROVIDER_SITE_OTHER): Payer: Commercial Managed Care - HMO

## 2015-01-28 DIAGNOSIS — Z23 Encounter for immunization: Secondary | ICD-10-CM | POA: Diagnosis not present

## 2015-03-06 ENCOUNTER — Ambulatory Visit (HOSPITAL_COMMUNITY): Payer: Self-pay | Admitting: Psychiatry

## 2015-04-16 ENCOUNTER — Telehealth (HOSPITAL_COMMUNITY): Payer: Self-pay

## 2015-04-16 DIAGNOSIS — F323 Major depressive disorder, single episode, severe with psychotic features: Secondary | ICD-10-CM

## 2015-04-16 NOTE — Telephone Encounter (Signed)
Medication refill requests - Faxes received for new 90 day orders of patient's Trazodone and  Prozac.  Patient's next evaluaiton set for 04/24/15.

## 2015-04-17 ENCOUNTER — Ambulatory Visit (HOSPITAL_COMMUNITY): Payer: Self-pay | Admitting: Psychiatry

## 2015-04-17 ENCOUNTER — Other Ambulatory Visit: Payer: Self-pay | Admitting: Family Medicine

## 2015-04-17 MED ORDER — LOSARTAN POTASSIUM-HCTZ 100-12.5 MG PO TABS: 1.0000 | ORAL_TABLET | Freq: Every day | ORAL | Status: DC

## 2015-04-22 MED ORDER — TRAZODONE HCL 100 MG PO TABS: 100.0000 mg | ORAL_TABLET | Freq: Every evening | ORAL | Status: DC | PRN

## 2015-04-22 MED ORDER — HALOPERIDOL 5 MG PO TABS: 5.0000 mg | ORAL_TABLET | Freq: Every day | ORAL | Status: DC

## 2015-04-22 NOTE — Telephone Encounter (Signed)
Ok with me  To  Give  90 days  Of meds

## 2015-04-22 NOTE — Telephone Encounter (Signed)
I sent in the medications to the pharmacy and called the patient ot let him know that this was done

## 2015-04-24 ENCOUNTER — Ambulatory Visit (INDEPENDENT_AMBULATORY_CARE_PROVIDER_SITE_OTHER): Payer: Medicare HMO | Admitting: Psychiatry

## 2015-04-24 ENCOUNTER — Encounter (HOSPITAL_COMMUNITY): Payer: Self-pay | Admitting: Psychiatry

## 2015-04-24 VITALS — BP 125/83 | HR 99 | Ht 69.0 in | Wt 205.8 lb

## 2015-04-24 DIAGNOSIS — F200 Paranoid schizophrenia: Secondary | ICD-10-CM | POA: Diagnosis not present

## 2015-04-24 DIAGNOSIS — F323 Major depressive disorder, single episode, severe with psychotic features: Secondary | ICD-10-CM

## 2015-04-24 DIAGNOSIS — F333 Major depressive disorder, recurrent, severe with psychotic symptoms: Secondary | ICD-10-CM

## 2015-04-24 MED ORDER — HALOPERIDOL 5 MG PO TABS: 5.0000 mg | ORAL_TABLET | Freq: Every day | ORAL | Status: DC

## 2015-04-24 MED ORDER — FLUOXETINE HCL 20 MG PO CAPS: ORAL_CAPSULE | ORAL | Status: DC

## 2015-04-24 MED ORDER — TRAZODONE HCL 100 MG PO TABS: 100.0000 mg | ORAL_TABLET | Freq: Every evening | ORAL | Status: DC | PRN

## 2015-04-24 MED ORDER — GABAPENTIN 400 MG PO CAPS: 400.0000 mg | ORAL_CAPSULE | Freq: Three times a day (TID) | ORAL | Status: DC
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
## 2015-09-25 ENCOUNTER — Ambulatory Visit (INDEPENDENT_AMBULATORY_CARE_PROVIDER_SITE_OTHER): Payer: Medicare HMO | Admitting: Psychiatry

## 2015-09-25 VITALS — BP 126/82 | HR 90 | Ht 69.0 in | Wt 200.8 lb

## 2015-09-25 DIAGNOSIS — F323 Major depressive disorder, single episode, severe with psychotic features: Secondary | ICD-10-CM | POA: Diagnosis not present

## 2015-09-25 DIAGNOSIS — F3342 Major depressive disorder, recurrent, in full remission: Secondary | ICD-10-CM

## 2015-09-25 MED ORDER — TRAZODONE HCL 100 MG PO TABS: 100.0000 mg | ORAL_TABLET | Freq: Every evening | ORAL | Status: DC | PRN

## 2015-09-25 MED ORDER — GABAPENTIN 400 MG PO CAPS: ORAL_CAPSULE | ORAL | Status: DC

## 2015-09-25 MED ORDER — HALOPERIDOL 2 MG PO TABS: ORAL_TABLET | ORAL | Status: DC

## 2015-09-25 MED ORDER — FLUOXETINE HCL 20 MG PO CAPS: ORAL_CAPSULE | ORAL | Status: DC
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
## 2015-11-15 ENCOUNTER — Other Ambulatory Visit: Payer: Self-pay | Admitting: Family Medicine

## 2015-11-16 NOTE — Telephone Encounter (Signed)
Rx refill sent to pharmacy. 

## 2015-12-25 ENCOUNTER — Ambulatory Visit (INDEPENDENT_AMBULATORY_CARE_PROVIDER_SITE_OTHER): Payer: Medicare HMO | Admitting: Psychiatry

## 2015-12-25 ENCOUNTER — Encounter (HOSPITAL_COMMUNITY): Payer: Self-pay | Admitting: Psychiatry

## 2015-12-25 VITALS — BP 132/78 | HR 85 | Ht 69.0 in | Wt 206.6 lb

## 2015-12-25 DIAGNOSIS — F323 Major depressive disorder, single episode, severe with psychotic features: Secondary | ICD-10-CM

## 2015-12-25 DIAGNOSIS — F333 Major depressive disorder, recurrent, severe with psychotic symptoms: Secondary | ICD-10-CM

## 2015-12-25 MED ORDER — HALOPERIDOL 2 MG PO TABS: ORAL_TABLET | ORAL | 5 refills | Status: DC

## 2015-12-25 MED ORDER — FLUOXETINE HCL 20 MG PO CAPS: ORAL_CAPSULE | ORAL | 6 refills | Status: DC

## 2015-12-25 MED ORDER — GABAPENTIN 400 MG PO CAPS: ORAL_CAPSULE | ORAL | 5 refills | Status: DC

## 2015-12-25 MED ORDER — TRAZODONE HCL 100 MG PO TABS: ORAL_TABLET | ORAL | 10 refills | Status: DC

## 2015-12-25 NOTE — Progress Notes (Signed)
Patient ID: Gabriel Miller, male   DOB: 08/14/1960, 55 y.o.   MRN: [id number] [facility]  12/25/2015 9:23 AM Gabriel Miller  MRN:  [id number] Subjective:  Principal Problem: Major depression with psychosis Diagnosis:  Major depression with psychosis Today the patient is seen on time. He came with no family members. When her last visit we talked about that we would not discontinue his Haldol until we had some family input. We did reduce his Haldol from 5 down to 2 mg and notes no problems. He demonstrates no evidence of psychosis. He is not agitated. He is somewhat malodorous and disheveled. The patient says that he is not sleeping well. He takes naps through the day. He's never sent this to me before. He is eating well and has no complaints of concentration. He's got good energy. He feels a good sense of worth. He denies suicidal thinking. Enjoys watching TV and playing box games. He has a dog that he enjoys. He shows no evidence of delusional material. His mother is fairly stable but she is on a walker and he must care for her. Is not clear why his brother is not here today. Once again in review of past notes this individual psychosis occurred only when he was abusing crack cocaine. Is been clean of all drugs for over 10 years. The patient says he has got a girlfriend named Selena BattenKim is been with for 40 years. There is no romance between the 2 but is close with her. Patient overall is doing well. Total Time spent with patient: 30 minutes   Past Medical History:  Past Medical History:  Diagnosis Date  . Alcohol abuse   . Depression   . GERD (gastroesophageal reflux disease)   . Hypertension     Past Surgical History:  Procedure Laterality Date  . MIDDLE EAR SURGERY  1990   Family History:  Family History  Problem Relation Age of Onset  . Arthritis Mother   . Heart disease Mother   . Hypertension Mother   . Diabetes Mother   . COPD Mother   . Alcohol abuse Father   . Hypertension  Father   . Diabetes Father   . Heart attack Father   . Hypertension Maternal Aunt   . Cancer Maternal Aunt     breast  . Cancer Maternal Grandmother     breast  . Diabetes Maternal Grandmother   . Diabetes Maternal Grandfather   . Diabetes Paternal Grandfather    Social History:  History  Alcohol Use  . Yes    Comment: drinks half-gallon liquor once a week.     History  Drug use: Unknown    Social History   Social History  . Marital status: Single    Spouse name: N/A  . Number of children: N/A  . Years of education: N/A   Social History Main Topics  . Smoking status: Current Every Day Smoker    Packs/day: 11.00    Years: 20.00    Types: Cigarettes  . Smokeless tobacco: None  . Alcohol use Yes     Comment: drinks half-gallon liquor once a week.  . Drug use: Unknown  . Sexual activity: Not Asked   Other Topics Concern  . None   Social History Narrative  . None   Additional History:    Sleep: Good  Appetite:  Good   Assessment:   Musculoskeletal: Strength & Muscle Tone: within normal limits Gait & Station: normal Patient leans: Right  Psychiatric Specialty Exam: Physical Exam  ROS  Blood pressure 132/78, pulse 85, height 5\' 9"  (1.753 m), weight 206 lb 9.6 oz (93.7 kg).Body mass index is 30.51 kg/m.  General Appearance: NA  Eye Contact::  Good  Speech:  Normal Rate  Volume:  Normal  Mood:  NA  Affect:  Appropriate  Thought Process:  Goal Directed  Orientation:  Full (Time, Place, and Person)  Thought Content:  WDL  Suicidal Thoughts:  No  Homicidal Thoughts:  No  Memory:  NA  Judgement:  Good  Insight:  Good  Psychomotor Activity:  Normal  Concentration:  Good  Recall:  Good  Fund of Knowledge:Good  Language: Poor  Akathisia:  No  Handed:  Right  AIMS (if indicated):     Assets:  Desire for Improvement  ADL's:  Intact  Cognition: WNL  Sleep:        Current Medications: Current Outpatient Prescriptions  Medication Sig  Dispense Refill  . albuterol (PROVENTIL) (5 MG/ML) 0.5% nebulizer solution Take 0.5 mLs (2.5 mg total) by nebulization every 2 (two) hours as needed for wheezing or shortness of breath. 20 mL 0  . fenofibrate 160 MG tablet Take 1 tablet (160 mg total) by mouth daily. 90 tablet 3  . FLUoxetine (PROZAC) 20 MG capsule 2 qam 60 capsule 6  . gabapentin (NEURONTIN) 400 MG capsule 1  qam   3  qhs 120 capsule 5  . haloperidol (HALDOL) 2 MG tablet 1 qhs 30 tablet 5  . losartan-hydrochlorothiazide (HYZAAR) 100-12.5 MG tablet TAKE 1 TABLET BY MOUTH DAILY 90 tablet 0  . sildenafil (VIAGRA) 100 MG tablet Take 0.5-1 tablets (50-100 mg total) by mouth daily as needed for erectile dysfunction. 5 tablet 11  . tiotropium (SPIRIVA) 18 MCG inhalation capsule Place 1 capsule (18 mcg total) into inhaler and inhale daily. 30 capsule 5  . traZODone (DESYREL) 100 MG tablet 2  qhs  May add a third pill 90 tablet 10   No current facility-administered medications for this visit.     Lab Results: No results found for this or any previous visit (from the past 48 hour(s)).  Physical Findings: AIMS:  , ,  ,  ,    CIWA:    COWS:     Treatment Plan Summary: At this time we will continue his Haldol at 2 mg. He demonstrates no overt evidence of tardive dyskinesia or EPS. We'll continue taking Neurontin 400 mg 1 in the morning and 3 at night, Prozac 40 mg and today we'll go ahead and increase his trazodone. He was taking 100 mg at night we will asked him to double dose to a dose of 200 mg and availability to take another 100 mg it he doesn't let him sleep through the night. The patient continues to be free of drugs and alcohol. Is #1 problem therefore is clinical depression with psychosis I do not believe psychosis is significant at all. Our treatment interventions will be to slowly get him off Haldol hopefully by his next visit. It is next visit I shared with him that he could bring his girlfriend Selena Batten just as well as brother and  either said he was stable I would probably discontinue his Haldol. Continue taking 40 mg of Prozac for his depression and for second problem of sleep we'll raise his trazodone. This patient is not suicidal or homicidal. He is no physical complaints. He is no chest pain shortness of breath and he does have a regular primary care  doctor.  Medical Decision Making:  Self-Limited or Minor (1)     Joannie Springs [facility] 12/25/2015, 9:23 AM

## 2016-02-04 ENCOUNTER — Telehealth: Payer: Self-pay

## 2016-02-04 NOTE — Telephone Encounter (Signed)
Call to request call back to schedule awv VS OR CPE Last seen by Dr. Caryl NeverBurchette was 09/2014 Needs to schedule prior to 03/20/2016/sh

## 2016-02-10 NOTE — Telephone Encounter (Signed)
Left VM to call and schedule AWV this year with health coach or Dr. Caryl [facility]Burchette. [facility] if possible

## 2016-02-18 ENCOUNTER — Other Ambulatory Visit: Payer: Self-pay | Admitting: Internal Medicine

## 2016-02-23 ENCOUNTER — Ambulatory Visit (INDEPENDENT_AMBULATORY_CARE_PROVIDER_SITE_OTHER): Payer: Commercial Managed Care - HMO | Admitting: Family Medicine

## 2016-02-23 ENCOUNTER — Encounter: Payer: Self-pay | Admitting: Family Medicine

## 2016-02-23 VITALS — BP 128/82 | HR 98 | Ht 69.0 in | Wt 205.0 lb

## 2016-02-23 DIAGNOSIS — R55 Syncope and collapse: Secondary | ICD-10-CM | POA: Diagnosis not present

## 2016-02-23 DIAGNOSIS — Z23 Encounter for immunization: Secondary | ICD-10-CM

## 2016-02-23 DIAGNOSIS — Z0000 Encounter for general adult medical examination without abnormal findings: Secondary | ICD-10-CM

## 2016-02-23 DIAGNOSIS — M25512 Pain in left shoulder: Secondary | ICD-10-CM | POA: Diagnosis not present

## 2016-02-23 LAB — CBC WITH DIFFERENTIAL/PLATELET
BASOS ABS: 0.1 10*3/uL (ref 0.0–0.1)
Basophils Relative: 0.8 % (ref 0.0–3.0)
EOS ABS: 0.6 10*3/uL (ref 0.0–0.7)
Eosinophils Relative: 8 % — ABNORMAL HIGH (ref 0.0–5.0)
HEMATOCRIT: 46 % (ref 39.0–52.0)
HEMOGLOBIN: 15.7 g/dL (ref 13.0–17.0)
LYMPHS PCT: 30.2 % (ref 12.0–46.0)
Lymphs Abs: 2.1 10*3/uL (ref 0.7–4.0)
MCHC: 34.2 g/dL (ref 30.0–36.0)
MCV: 90.3 fl (ref 78.0–100.0)
Monocytes Absolute: 0.6 10*3/uL (ref 0.1–1.0)
Monocytes Relative: 8.4 % (ref 3.0–12.0)
Neutro Abs: 3.7 10*3/uL (ref 1.4–7.7)
Neutrophils Relative %: 52.6 % (ref 43.0–77.0)
Platelets: 225 10*3/uL (ref 150.0–400.0)
RBC: 5.09 Mil/uL (ref 4.22–5.81)
RDW: 14.9 % (ref 11.5–15.5)
WBC: 7 10*3/uL (ref 4.0–10.5)

## 2016-02-23 LAB — HEPATIC FUNCTION PANEL
ALBUMIN: 4 g/dL (ref 3.5–5.2)
ALK PHOS: 66 U/L (ref 39–117)
ALT: 23 U/L (ref 0–53)
AST: 13 U/L (ref 0–37)
BILIRUBIN DIRECT: 0.1 mg/dL (ref 0.0–0.3)
TOTAL PROTEIN: 6.4 g/dL (ref 6.0–8.3)
Total Bilirubin: 0.3 mg/dL (ref 0.2–1.2)

## 2016-02-23 LAB — BASIC METABOLIC PANEL
BUN: 15 mg/dL (ref 6–23)
CHLORIDE: 100 meq/L (ref 96–112)
CO2: 29 meq/L (ref 19–32)
Calcium: 9.3 mg/dL (ref 8.4–10.5)
Creatinine, Ser: 1.2 mg/dL (ref 0.40–1.50)
GFR: 66.91 mL/min (ref >60.00)
Glucose, Bld: 99 mg/dL (ref 70–99)
Potassium: 4 mEq/L (ref 3.5–5.1)
SODIUM: 139 meq/L (ref 135–145)

## 2016-02-23 LAB — LIPID PANEL
Cholesterol: 251 mg/dL — ABNORMAL HIGH (ref 0–200)
HDL: 28.6 mg/dL — AB (ref >39.00)
Total CHOL/HDL Ratio: 9

## 2016-02-23 LAB — TSH: TSH: 4.54 u[IU]/mL — ABNORMAL HIGH (ref 0.35–4.50)

## 2016-02-23 LAB — LDL CHOLESTEROL, DIRECT: Direct LDL: 107 mg/dL

## 2016-02-23 LAB — PSA: PSA: 0.74 ng/mL (ref 0.10–4.00)

## 2016-02-23 MED ORDER — HYDROCODONE-ACETAMINOPHEN 5-325 MG PO TABS: 1.0000 | ORAL_TABLET | Freq: Four times a day (QID) | ORAL | 0 refills | Status: AC | PRN

## 2016-02-23 NOTE — Progress Notes (Signed)
Subjective:     Patient ID: Gabriel Miller, male   DOB: 08/17/1960, 55 y.o.   MRN: [id number]  HPI Patient seen with left shoulder pain following recent syncopal episode. He states 2 weeks ago he was on the toilet straining to have a bowel movement and suddenly felt dizzy and sweaty. No chest pain. He states he fell over toward his right side and thinks that he struck his head against a door. He is not aware of landing on his left shoulder but when he came around he had some left shoulder pain which is persistent since then. His pain is somewhat poorly localized and includes left upper back and left shoulder region. He did not notice any bruising or swelling. He's had no headaches. No further dizziness. No recurrent syncope. Denies any prior history of syncope.  Left shoulder pain is worse at night and also worse with abduction and internal rotation. Pain is sharp and sometimes severe at times. Not relieved with Tylenol. Denies radiculopathy symptoms. Denies upper extremity numbness. Very limited range of motion secondary to pain. He's not had any recent confusion. No nausea or vomiting. No focal weakness.  Patient has multiple chronic problems including history of hypertension, COPD, chronic kidney disease, dyslipidemia, bipolar disorder followed by psychiatry. History of poor compliance with follow-up.  Past Medical History:  Diagnosis Date  . Alcohol abuse   . Depression   . GERD (gastroesophageal reflux disease)   . Hypertension    Past Surgical History:  Procedure Laterality Date  . MIDDLE EAR SURGERY  1990    reports that he has been smoking Cigarettes.  He has a 220.00 pack-year smoking history. He does not have any smokeless tobacco history on file. He reports that he drinks alcohol. His drug history is not on file. family history includes Alcohol abuse in his father; Arthritis in his mother; COPD in his mother; Cancer in his maternal aunt and maternal grandmother; Diabetes in his father,  maternal grandfather, maternal grandmother, mother, and paternal grandfather; Heart attack in his father; Heart disease in his mother; Hypertension in his father, maternal aunt, and mother. No Known Allergies   Review of Systems  Constitutional: Negative for fatigue.  Eyes: Negative for visual disturbance.  Respiratory: Negative for cough, chest tightness and shortness of breath.   Cardiovascular: Negative for chest pain, palpitations and leg swelling.  Gastrointestinal: Negative for abdominal pain.  Neurological: Positive for syncope (See history of present illness). Negative for weakness and headaches.  Psychiatric/Behavioral: Negative for confusion.       Objective:   Physical Exam  Constitutional: He is oriented to person, place, and time. He appears well-developed and well-nourished.  HENT:  Right Ear: External ear normal.  Left Ear: External ear normal.  Mouth/Throat: Oropharynx is clear and moist.  Eyes: Pupils are equal, round, and reactive to light.  Neck: Neck supple. No thyromegaly present.  Cardiovascular: Normal rate and regular rhythm.   Pulmonary/Chest: Effort normal and breath sounds normal. No respiratory distress. He has no wheezes. He has no rales.  Musculoskeletal: He exhibits no edema.  Left shoulder no visible swelling. No ecchymosis. He has limited range of motion with abduction and internal rotation secondary to pain. No clavicle tenderness. No acromioclavicular tenderness. Mild proximal left biceps tenderness.  Neurological: He is alert and oriented to person, place, and time.       Assessment:     #1 probable recent vasovagal syncopal episode. Given the setting that he was on the pole what this  was very likely related to vasovagal syncope. No episode since then  #2 left shoulder pain following fall. Needs x-rays to further assess. May have rotator cuff injury  #3 ongoing nicotine use    Plan:     -X-rays left shoulder -Flu vaccine given -Set up  complete physical. Obtain lab work today -Follow-up immediately for any recurrent syncope or dizziness  Kristian CoveyBruce W Altovise Wahler MD [facility]

## 2016-02-23 NOTE — Patient Instructions (Signed)
Go for left shoulder X-ray Set up physical exam

## 2016-02-24 ENCOUNTER — Telehealth: Payer: Self-pay | Admitting: Family Medicine

## 2016-02-24 ENCOUNTER — Ambulatory Visit (INDEPENDENT_AMBULATORY_CARE_PROVIDER_SITE_OTHER)
Admission: RE | Admit: 2016-02-24 | Discharge: 2016-02-24 | Disposition: A | Discharge status: Home / Self Care | Payer: Commercial Managed Care - HMO | Source: Ambulatory Visit | Attending: Family Medicine | Admitting: Family Medicine

## 2016-02-24 DIAGNOSIS — M25512 Pain in left shoulder: Secondary | ICD-10-CM

## 2016-02-24 NOTE — Telephone Encounter (Signed)
Pt would like a call back concerning lab and xray results when available

## 2016-02-24 NOTE — Telephone Encounter (Signed)
Please see lab note.

## 2016-02-26 ENCOUNTER — Other Ambulatory Visit: Payer: Self-pay

## 2016-02-26 MED ORDER — ROSUVASTATIN CALCIUM 20 MG PO TABS: 20.0000 mg | ORAL_TABLET | Freq: Every day | ORAL | 1 refills | Status: AC

## 2016-03-16 ENCOUNTER — Encounter: Payer: Self-pay | Admitting: Family Medicine

## 2016-03-16 ENCOUNTER — Ambulatory Visit (INDEPENDENT_AMBULATORY_CARE_PROVIDER_SITE_OTHER): Payer: Commercial Managed Care - HMO | Admitting: Family Medicine

## 2016-03-16 VITALS — BP 94/72 | HR 91 | Temp 98.4°F | Ht 69.0 in | Wt 208.7 lb

## 2016-03-16 DIAGNOSIS — M7582 Other shoulder lesions, left shoulder: Secondary | ICD-10-CM

## 2016-03-16 MED ORDER — METHYLPREDNISOLONE ACETATE 80 MG/ML IJ SUSP
80.0000 mg | Freq: Once | INTRAMUSCULAR | Status: AC
  Administered 2016-03-16: 80 mg via INTRA_ARTICULAR

## 2016-03-16 NOTE — Progress Notes (Signed)
Subjective:     Patient ID: Gabriel LinkRobert C Balon, male   DOB: 10/03/1960, 55 y.o.   MRN: [id number]  HPI Patient seen with persistent left shoulder pain. Only slightly improved from last visit. He had a fall following syncopal episode which was likely vasovagal. He's had no further dizziness or syncope since then. He still has significant pain with internal rotation and abduction of the left shoulder. No neck pain. No definite weakness. Pain is frequently waking him up at night when he rolls onto his side. Moderate severity  Past Medical History:  Diagnosis Date  . Alcohol abuse   . Depression   . GERD (gastroesophageal reflux disease)   . Hypertension    Past Surgical History:  Procedure Laterality Date  . MIDDLE EAR SURGERY  1990    reports that he has been smoking Cigarettes.  He has a 220.00 pack-year smoking history. He does not have any smokeless tobacco history on file. He reports that he drinks alcohol. His drug history is not on file. family history includes Alcohol abuse in his father; Arthritis in his mother; COPD in his mother; Cancer in his maternal aunt and maternal grandmother; Diabetes in his father, maternal grandfather, maternal grandmother, mother, and paternal grandfather; Heart attack in his father; Heart disease in his mother; Hypertension in his father, maternal aunt, and mother. No Known Allergies   Review of Systems  Neurological: Negative for weakness and numbness.       Objective:   Physical Exam  Constitutional: He appears well-developed and well-nourished.  Cardiovascular: Normal rate and regular rhythm.   Pulmonary/Chest: Effort normal and breath sounds normal.  Musculoskeletal:  Patient has some subacromial tenderness on left side. No acromioclavicular tenderness. No biceps tenderness. He has pain with abduction greater than about 80 and also internal rotation.  Neurological: He has normal reflexes.  No obvious rotator cuff weakness.       Assessment:     Left shoulder pain. Suspect rotator cuff tendinitis. Recent x-rays revealed mild acromioclavicular degenerative changes but no acute finding    Plan:      Discussed risks and benefits of corticosteroid injection and patient consented.  After prepping skin with betadine, injected 1 cc depomedrol and 2 cc of plain xylocaine with 25 gauge one and one half inch needle using posterior lateral approach and pt tolerated well. -He will ice tonight. -gentle ROM next several days -has CPE soon (next month) and reassess then.  Kristian CoveyBruce W Melinna Linarez MD [facility]

## 2016-03-16 NOTE — Progress Notes (Signed)
Pre visit review using our clinic review tool, if applicable. No additional management support is needed unless otherwise documented below in the visit note. 

## 2016-03-16 NOTE — Patient Instructions (Signed)
Rotator Cuff Tendinitis Rotator cuff tendinitis is inflammation of the tough, cord-like bands that connect muscle to bone (tendons) in your rotator cuff. Your rotator cuff is the collection of all the muscles and tendons that connect your arm to your shoulder. Your rotator cuff holds the head of your upper arm bone (humerus) in the cup (fossa) of your shoulder blade (scapula). CAUSES Rotator cuff tendinitis is usually caused by overusing the joint involved.  SIGNS AND SYMPTOMS  Deep ache in the shoulder also felt on the outside upper arm over the shoulder muscle.  Point tenderness over the area that is injured.  Pain comes on gradually and becomes worse with lifting the arm to the side (abduction) or turning it inward (internal rotation).  May lead to a chronic tear: When a rotator cuff tendon becomes inflamed, it runs the risk of losing its blood supply, causing some tendon fibers to die. This increases the risk that the tendon can fray and partially or completely tear. DIAGNOSIS Rotator cuff tendinitis is diagnosed by taking a medical history, performing a physical exam, and reviewing results of imaging exams. The medical history is useful to help determine the type of rotator cuff injury. The physical exam will include looking at the injured shoulder, feeling the injured area, and watching you do range-of-motion exercises. X-ray exams are typically done to rule out other causes of shoulder pain, such as fractures. MRI is the imaging exam usually used for significant shoulder injuries. Sometimes a dye study called CT arthrogram is done, but it is not as widely used as MRI. In some institutions, special ultrasound tests may also be used to aid in the diagnosis. TREATMENT  Less Severe Cases   Use of a sling to rest the shoulder for a short period of time. Prolonged use of the sling can cause stiffness, weakness, and loss of motion of the shoulder joint.  Anti-inflammatory medicines, such as  ibuprofen or naproxen sodium, may be prescribed. More Severe Cases   Physical therapy.  Use of steroid injections into the shoulder joint.  Surgery. HOME CARE INSTRUCTIONS   Use a sling or splint until the pain decreases. Prolonged use of the sling can cause stiffness, weakness, and loss of motion of the shoulder joint.  Apply ice to the injured area:  Put ice in a plastic bag.  Place a towel between your skin and the bag.  Leave the ice on for 20 minutes, 2-3 times a day.  Try to avoid use other than gentle range of motion while your shoulder is painful. Use the shoulder and exercise only as directed by your health care provider. Stop exercises or range of motion if pain or discomfort increases, unless directed otherwise by your health care provider.  Only take over-the-counter or prescription medicines for pain, discomfort, or fever as directed by your health care provider.  If you were given a shoulder sling and straps (immobilizer), do not remove it except as directed, or until you see a health care provider for a follow-up exam. If you need to remove it, move your arm as little as possible or as directed.  You may want to sleep on several pillows at night to lessen swelling and pain. SEEK IMMEDIATE MEDICAL CARE IF:   Your shoulder pain increases or new pain develops in your arm, hand, or fingers and is not relieved with medicines.  You have new, unexplained symptoms, especially increased numbness in the hands or loss of strength.  You develop any worsening of the   problems that brought you in for care.  Your arm, hand, or fingers are numb or tingling.  Your arm, hand, or fingers are swollen, painful, or turn Bee or blue. MAKE SURE YOU:  Understand these instructions.  Will watch your condition.  Will get help right away if you are not doing well or get worse. This information is not intended to replace advice given to you by your health care provider. Make sure you  discuss any questions you have with your health care provider. Document Released: 05/28/2003 Document Revised: 03/28/2014 Document Reviewed: 10/17/2012 Elsevier Interactive Patient Education  2017 [employer]vier Inc[employer]  

## 2016-03-30 ENCOUNTER — Encounter (INDEPENDENT_AMBULATORY_CARE_PROVIDER_SITE_OTHER): Payer: Medicare HMO | Admitting: Family Medicine

## 2016-03-30 DIAGNOSIS — Z0289 Encounter for other administrative examinations: Secondary | ICD-10-CM

## 2016-04-01 ENCOUNTER — Ambulatory Visit (INDEPENDENT_AMBULATORY_CARE_PROVIDER_SITE_OTHER): Payer: Medicare HMO | Admitting: Psychiatry

## 2016-04-01 ENCOUNTER — Encounter (HOSPITAL_COMMUNITY): Payer: Self-pay | Admitting: Psychiatry

## 2016-04-01 VITALS — BP 136/88 | HR 88 | Ht 69.0 in | Wt 207.0 lb

## 2016-04-01 DIAGNOSIS — Z803 Family history of malignant neoplasm of breast: Secondary | ICD-10-CM

## 2016-04-01 DIAGNOSIS — F3342 Major depressive disorder, recurrent, in full remission: Secondary | ICD-10-CM | POA: Diagnosis not present

## 2016-04-01 DIAGNOSIS — Z8261 Family history of arthritis: Secondary | ICD-10-CM

## 2016-04-01 DIAGNOSIS — Z833 Family history of diabetes mellitus: Secondary | ICD-10-CM

## 2016-04-01 DIAGNOSIS — Z8249 Family history of ischemic heart disease and other diseases of the circulatory system: Secondary | ICD-10-CM

## 2016-04-01 DIAGNOSIS — F17210 Nicotine dependence, cigarettes, uncomplicated: Secondary | ICD-10-CM

## 2016-04-01 NOTE — Progress Notes (Signed)
Patient ID: Gabriel Miller, male   DOB: 06/17/1960, 56 y.o.   MRN: [id number] [facility]  04/01/2016 8:55 AM Gabriel Miller  MRN:  [id number] Subjective:  Principal Problem: Major depression with psychosis Diagnosis:  Major depression with psychosis Today the patient was seen one time with his sister-in-law Harriett SineNancy. Harriett Sineancy is noted over 5 years. The patient is doing well. He's had no changes since I saw him last. He denies daily depression. He is enjoying life. He denies auditory or visual hallucinations or any delusional material. The patient still looks a bit disheveled but says he is well he denies the use of alcohol or drugs. In retrospect I think his sleep is stable. He has to get out through the night to help his elderly sick mother. He will state that his mother wasn't waking him up and needing his assistance he would sleep through the night. The patient is eating well. He takes his medicines as prescribed. Her last visit we did increase his trazodone from 100 mg to 200 mg and he says it is beneficial. Today reviewed his past history of crack cocaine use inducing a state of psychosis. That was over a decade ago. He is slowly been titrated down on Haldol down to 2 mg and notes no difference on the lower dose.   Past Medical History:  Past Medical History:  Diagnosis Date  . Alcohol abuse   . Depression   . GERD (gastroesophageal reflux disease)   . Hypertension     Past Surgical History:  Procedure Laterality Date  . MIDDLE EAR SURGERY  1990   Family History:  Family History  Problem Relation Age of Onset  . Arthritis Mother   . Heart disease Mother   . Hypertension Mother   . Diabetes Mother   . COPD Mother   . Alcohol abuse Father   . Hypertension Father   . Diabetes Father   . Heart attack Father   . Hypertension Maternal Aunt   . Cancer Maternal Aunt     breast  . Cancer Maternal Grandmother     breast  . Diabetes Maternal Grandmother   . Diabetes Maternal  Grandfather   . Diabetes Paternal Grandfather    Social History:  History  Alcohol Use No    Comment: drinks half-gallon liquor once a week.     History  Drug Use No    Social History   Social History  . Marital status: Single    Spouse name: N/A  . Number of children: N/A  . Years of education: N/A   Social History Main Topics  . Smoking status: Current Every Day Smoker    Packs/day: 1.00    Years: 20.00    Types: Cigarettes  . Smokeless tobacco: Never Used  . Alcohol use No     Comment: drinks half-gallon liquor once a week.  . Drug use: No  . Sexual activity: Not Currently   Other Topics Concern  . None   Social History Narrative  . None   Additional History:    Sleep: Good  Appetite:  Good   Assessment:   Musculoskeletal: Strength & Muscle Tone: within normal limits Gait & Station: normal Patient leans: Right   Psychiatric Specialty Exam: Physical Exam  ROS  Blood pressure 136/88, pulse 88, height 5\' 9"  (1.753 m), weight 207 lb (93.9 kg).Body mass index is 30.57 kg/m.  General Appearance: NA  Eye Contact::  Good  Speech:  Normal Rate  Volume:  Normal  Mood:  NA  Affect:  Appropriate  Thought Process:  Goal Directed  Orientation:  Full (Time, Place, and Person)  Thought Content:  WDL  Suicidal Thoughts:  No  Homicidal Thoughts:  No  Memory:  NA  Judgement:  Good  Insight:  Good  Psychomotor Activity:  Normal  Concentration:  Good  Recall:  Good  Fund of Knowledge:Good  Language: Poor  Akathisia:  No  Handed:  Right  AIMS (if indicated):     Assets:  Desire for Improvement  ADL's:  Intact  Cognition: WNL  Sleep:        Current Medications: Current Outpatient Prescriptions  Medication Sig Dispense Refill  . albuterol (PROVENTIL) (5 MG/ML) 0.5% nebulizer solution Take 0.5 mLs (2.5 mg total) by nebulization every 2 (two) hours as needed for wheezing or shortness of breath. 20 mL 0  . fenofibrate 160 MG tablet Take 1 tablet (160  mg total) by mouth daily. 90 tablet 3  . FLUoxetine (PROZAC) 20 MG capsule 2 qam 60 capsule 6  . gabapentin (NEURONTIN) 400 MG capsule 1  qam   3  qhs 120 capsule 5  . haloperidol (HALDOL) 2 MG tablet 1 qhs 30 tablet 5  . HYDROcodone-acetaminophen (NORCO/VICODIN) 5-325 MG tablet Take 1 tablet by mouth every 6 (six) hours as needed for moderate pain. 20 tablet 0  . losartan-hydrochlorothiazide (HYZAAR) 100-12.5 MG tablet TAKE 1 TABLET BY MOUTH DAILY 90 tablet 0  . rosuvastatin (CRESTOR) 20 MG tablet Take 1 tablet (20 mg total) by mouth daily. 90 tablet 1  . sildenafil (VIAGRA) 100 MG tablet Take 0.5-1 tablets (50-100 mg total) by mouth daily as needed for erectile dysfunction. 5 tablet 11  . tiotropium (SPIRIVA) 18 MCG inhalation capsule Place 1 capsule (18 mcg total) into inhaler and inhale daily. 30 capsule 5  . traZODone (DESYREL) 100 MG tablet 2  qhs  May add a third pill 90 tablet 10   No current facility-administered medications for this visit.     Lab Results: No results found for this or any previous visit (from the past 48 hour(s)).  Physical Findings: AIMS:  , ,  ,  ,    CIWA:    COWS:     Treatment Plan Summary: At this time the patient will discontinue his Haldol. The patient and his sister-in-law are told to call us immediately if he notes any changes such as hearing voices. At this time the patient shows no evidence of tardive dyskinesia. He takes all his medicines as prescribed including Neurontin 400 mg 1 in the morning and 3 at night, Prozac 40 mg and 200 mg of trazodone. The patient appears safe. He denies being suicidal or homicidal. He is functioning fairly well. He'll return to see me in 2 months to check his status and to take some time in review his last discharge summary. I think the patient is very stable at this time. He denies chest pain or shortness of breath or any other neurological symptoms.It is noted the patient has all of his medications and has refills. Today  he'll receive no prescriptions. Medical Decision Making:  Self-Limited or Minor (1)     Joannie Springs [facility] 04/01/2016, 8:55 AM

## 2016-05-18 ENCOUNTER — Ambulatory Visit: Payer: Self-pay | Admitting: Family Medicine

## 2016-05-18 DIAGNOSIS — Z0289 Encounter for other administrative examinations: Secondary | ICD-10-CM

## 2016-05-19 ENCOUNTER — Other Ambulatory Visit: Payer: Self-pay | Admitting: Family Medicine

## 2016-06-03 ENCOUNTER — Ambulatory Visit (HOSPITAL_COMMUNITY): Payer: Self-pay | Admitting: Psychiatry

## 2016-06-13 ENCOUNTER — Encounter: Payer: Self-pay | Admitting: Family Medicine

## 2016-06-13 ENCOUNTER — Ambulatory Visit (INDEPENDENT_AMBULATORY_CARE_PROVIDER_SITE_OTHER): Payer: Medicare HMO | Admitting: Family Medicine

## 2016-06-13 VITALS — BP 110/88 | HR 96 | Temp 99.3°F | Wt 201.9 lb

## 2016-06-13 DIAGNOSIS — M7582 Other shoulder lesions, left shoulder: Secondary | ICD-10-CM

## 2016-06-13 DIAGNOSIS — M25512 Pain in left shoulder: Secondary | ICD-10-CM

## 2016-06-13 MED ORDER — METHYLPREDNISOLONE ACETATE 80 MG/ML IJ SUSP
80.0000 mg | Freq: Once | INTRAMUSCULAR | Status: AC
  Administered 2016-06-13: 80 mg via INTRAMUSCULAR

## 2016-06-13 NOTE — Progress Notes (Signed)
Subjective:     Patient ID: Gabriel Miller, male   DOB: 10/27/1960, 56 y.o.   MRN: [id number]  HPI Patient seen for follow-up regarding left shoulder pain. We saw him back in December. He had a fall and x-ray revealed some degenerative changes of the acromioclavicular joint but no fracture. He was given steroid injection for suspected rotator cuff tendinitis and he saw good improvement - at least 70%. He started to have some night pain again but overall much improved range of motion compared to December. Injection helped substantially previously. He has not gotten relief with ice and anti-inflammatory medications. He's not a good candidate for long-term nonsteroidals because of his chronic kidney disease history. Denies any cervical radiculitis symptoms. No weakness  Past Medical History:  Diagnosis Date  . Alcohol abuse   . Depression   . GERD (gastroesophageal reflux disease)   . Hypertension    Past Surgical History:  Procedure Laterality Date  . MIDDLE EAR SURGERY  1990    reports that he has been smoking Cigarettes.  He has a 20.00 pack-year smoking history. He has never used smokeless tobacco. He reports that he does not drink alcohol or use drugs. family history includes Alcohol abuse in his father; Arthritis in his mother; COPD in his mother; Cancer in his maternal aunt and maternal grandmother; Diabetes in his father, maternal grandfather, maternal grandmother, mother, and paternal grandfather; Heart attack in his father; Heart disease in his mother; Hypertension in his father, maternal aunt, and mother. No Known Allergies   Review of Systems  Respiratory: Negative for cough and shortness of breath.   Cardiovascular: Negative for chest pain.  Neurological: Negative for weakness and numbness.       Objective:   Physical Exam  Constitutional: He appears well-developed and well-nourished.  Cardiovascular: Normal rate and regular rhythm.   Pulmonary/Chest: Effort normal and breath  sounds normal. No respiratory distress. He has no wheezes. He has no rales.  Musculoskeletal:  Good range of motion left shoulder. He has some subacromial tenderness. He has pain with abduction greater than 90 against resistance. Minimal pain with internal rotation. No biceps tenderness.       Assessment:     Left shoulder pain. Suspect rotator cuff tendinitis. Overall, improved following steroid injection in December but still has some night pain. Definitely has some increased and improved range of motion compared to last visit    Plan:     - Discussed risks and benefits of corticosteroid injection and patient consented.  After prepping skin with betadine, injected 1 mL depomedrol and 2 cc of plain xylocaine with 25 gauge one and one half inch needle using posterior lateral approach and pt tolerated well. -Is instructed in range of motion stretches -Touch base if not further improved in 2-3 weeks  Kristian CoveyBruce W Burchette MD [facility]

## 2016-06-13 NOTE — Patient Instructions (Signed)
Shoulder Impingement Syndrome Shoulder impingement syndrome is a condition that causes pain when connective tissues (tendons) surrounding the shoulder joint become pinched. These tendons are part of the group of muscles and tissues that help to stabilize the shoulder (rotator cuff). Beneath the rotator cuff is a fluid-filled sac (bursa) that allows the muscles and tendons to glide smoothly. The bursa may become swollen or irritated (bursitis). Bursitis, swelling in the rotator cuff tendons, or both conditions can decrease how much space is under a bone in the shoulder joint (acromion), resulting in impingement. What are the causes? Shoulder impingement syndrome can be caused by bursitis or swelling of the rotator cuff tendons, which may result from:  Repetitive overhead arm movements.  Falling onto the shoulder.  Weakness in the shoulder muscles. What increases the risk? You may be more likely to develop this condition if you are an athlete who participates in:  Sports that involve throwing, such as baseball.  Tennis.  Swimming.  Volleyball. Some people are also more likely to develop impingement syndrome because of the shape of their acromion bone. What are the signs or symptoms? The main symptom of this condition is pain on the front or side of the shoulder. Pain may:  Get worse when lifting or raising the arm.  Get worse at night.  Wake you up from sleeping.  Feel sharp when the shoulder is moved, and then fade to an ache. Other signs and symptoms may include:  Tenderness.  Stiffness.  Inability to raise the arm above shoulder level or behind the body.  Weakness. How is this diagnosed? This condition may be diagnosed based on:  Your symptoms.  Your medical history.  A physical exam.  Imaging tests, such as:  X-rays.  MRI.  Ultrasound. How is this treated? Treatment for this condition may include:  Resting your shoulder and avoiding all activities that  cause pain or put stress on the shoulder.  Icing your shoulder.  NSAIDs to help reduce pain and swelling.  One or more injections of medicines to numb the area and reduce inflammation.  Physical therapy.  Surgery. This may be needed if nonsurgical treatments have not helped. Surgery may involve repairing the rotator cuff, reshaping the acromion, or removing the bursa. Follow these instructions at home: Managing pain, stiffness, and swelling   If directed, apply ice to the injured area.  Put ice in a plastic bag.  Place a towel between your skin and the bag.  Leave the ice on for 20 minutes, 2-3 times a day. Activity   Rest and return to your normal activities as told by your health care provider. Ask your health care provider what activities are safe for you.  Do exercises as told by your health care provider. General instructions   Do not use any tobacco products, including cigarettes, chewing tobacco, or e-cigarettes. Tobacco can delay healing. If you need help quitting, ask your health care provider.  Ask your health care provider when it is safe for you to drive.  Take over-the-counter and prescription medicines only as told by your health care provider.  Keep all follow-up visits as told by your health care provider. This is important. How is this prevented?  Give your body time to rest between periods of activity.  Be safe and responsible while being active to avoid falls.  Maintain physical fitness, including strength and flexibility. Contact a health care provider if:  Your symptoms have not improved after 1-2 months of treatment and rest.  You   cannot lift your arm away from your body. This information is not intended to replace advice given to you by your health care provider. Make sure you discuss any questions you have with your health care provider. Document Released: 03/07/2005 Document Revised: 11/12/2015 Document Reviewed: 02/07/2015 Elsevier  Interactive Patient Education  2017 [employer]  

## 2016-06-13 NOTE — Progress Notes (Signed)
Pre visit review using our clinic review tool, if applicable. No additional management support is needed unless otherwise documented below in the visit note. 

## 2016-06-29 ENCOUNTER — Ambulatory Visit (INDEPENDENT_AMBULATORY_CARE_PROVIDER_SITE_OTHER): Payer: Medicare HMO | Admitting: Psychiatry

## 2016-06-29 VITALS — BP 140/87 | HR 89 | Ht 68.5 in | Wt 203.0 lb

## 2016-06-29 DIAGNOSIS — F323 Major depressive disorder, single episode, severe with psychotic features: Secondary | ICD-10-CM

## 2016-06-29 DIAGNOSIS — F17210 Nicotine dependence, cigarettes, uncomplicated: Secondary | ICD-10-CM | POA: Diagnosis not present

## 2016-06-29 DIAGNOSIS — Z79899 Other long term (current) drug therapy: Secondary | ICD-10-CM | POA: Diagnosis not present

## 2016-06-29 DIAGNOSIS — Z811 Family history of alcohol abuse and dependence: Secondary | ICD-10-CM | POA: Diagnosis not present

## 2016-06-29 DIAGNOSIS — F330 Major depressive disorder, recurrent, mild: Secondary | ICD-10-CM

## 2016-06-29 MED ORDER — GABAPENTIN 400 MG PO CAPS: ORAL_CAPSULE | ORAL | 5 refills | Status: DC

## 2016-06-29 MED ORDER — HALOPERIDOL 2 MG PO TABS: ORAL_TABLET | ORAL | 2 refills | Status: DC

## 2016-06-29 MED ORDER — TRAZODONE HCL 100 MG PO TABS: ORAL_TABLET | ORAL | 10 refills | Status: DC

## 2016-06-29 MED ORDER — FLUOXETINE HCL 20 MG PO CAPS: ORAL_CAPSULE | ORAL | 1 refills | Status: DC

## 2016-06-29 NOTE — Progress Notes (Signed)
Patient ID: Gabriel Miller, male   DOB: September 21, 1960, 56 y.o.   MRN: [id number] [facility] MD Progress Note  06/29/2016 3:11 PM BENNIE CHIRICO  MRN:  [id number] Subjective:  Principal Problem: Major depression with psychosis Diagnosis:  Major depression with psychosis Today the patient was seen with his sister in law, Pattricia Boss. Unfortunately last week this patient's mother an elderly woman living with them she died a natural death. A few days later which was days ago they had a funeral well. The patient has other siblings were involved in the care and now grieving the death of their mother. Is a good family support. Even before his mother died however patient began feeling unusual. He started feeling uncomfortable and is suspicious. Therefore he returned back to taking his Haldol 2 mg and now feels back to himself. The patient is been on approximately 5 or 6 mg in the past. We've slowly reduce it. Fortunately he did not become psychotic in any way. At this time he does feel depression. He appreciates that it is grieving process. He is sleeping poorly. Despite the fact taking 200 mg or even 300 mg of trazodone he's not sleeping. He is eating okay. He is not suicidal. Again he does not have any psychotic symptoms at this time. He stressed his usual way. The sister-in-law says he's doing better. Patient spent the last 5 years taking care of his mother. Heis constantly new to do this time..   Past Medical History:  Past Medical History:  Diagnosis Date  . Alcohol abuse   . Depression   . GERD (gastroesophageal reflux disease)   . Hypertension     Past Surgical History:  Procedure Laterality Date  . MIDDLE EAR SURGERY  1990   Family History:  Family History  Problem Relation Age of Onset  . Arthritis Mother   . Heart disease Mother   . Hypertension Mother   . Diabetes Mother   . COPD Mother   . Alcohol abuse Father   . Hypertension Father   . Diabetes Father   . Heart attack Father   . Hypertension  Maternal Aunt   . Cancer Maternal Aunt     breast  . Cancer Maternal Grandmother     breast  . Diabetes Maternal Grandmother   . Diabetes Maternal Grandfather   . Diabetes Paternal Grandfather    Social History:  History  Alcohol Use No    Comment: drinks half-gallon liquor once a week.     History  Drug Use No    Social History   Social History  . Marital status: Single    Spouse name: N/A  . Number of children: N/A  . Years of education: N/A   Social History Main Topics  . Smoking status: Current Every Day Smoker    Packs/day: 1.00    Years: 20.00    Types: Cigarettes  . Smokeless tobacco: Never Used  . Alcohol use No     Comment: drinks half-gallon liquor once a week.  . Drug use: No  . Sexual activity: Not Currently   Other Topics Concern  . Not on file   Social History Narrative  . No narrative on file   Additional History:    Sleep: Good  Appetite:  Good   Assessment:   Musculoskeletal: Strength & Muscle Tone: within normal limits Gait & Station: normal Patient leans: Right   Psychiatric Specialty Exam: Physical Exam  ROS  Blood pressure 140/87, pulse 89, height 5' 8.5" (1.74  m), weight 203 lb (92.1 kg).Body mass index is 30.42 kg/m.  General Appearance: NA  Eye Contact::  Good  Speech:  Normal Rate  Volume:  Normal  Mood:  NA  Affect:  Appropriate  Thought Process:  Goal Directed  Orientation:  Full (Time, Place, and Person)  Thought Content:  WDL  Suicidal Thoughts:  No  Homicidal Thoughts:  No  Memory:  NA  Judgement:  Good  Insight:  Good  Psychomotor Activity:  Normal  Concentration:  Good  Recall:  Good  Fund of Knowledge:Good  Language: Poor  Akathisia:  No  Handed:  Right  AIMS (if indicated):     Assets:  Desire for Improvement  ADL's:  Intact  Cognition: WNL  Sleep:        Current Medications: Current Outpatient Prescriptions  Medication Sig Dispense Refill  . albuterol (PROVENTIL) (5 MG/ML) 0.5% nebulizer  solution Take 0.5 mLs (2.5 mg total) by nebulization every 2 (two) hours as needed for wheezing or shortness of breath. 20 mL 0  . fenofibrate 160 MG tablet Take 1 tablet (160 mg total) by mouth daily. 90 tablet 3  . FLUoxetine (PROZAC) 20 MG capsule 2 qam 180 capsule 1  . gabapentin (NEURONTIN) 400 MG capsule 1  qam   3  qhs 120 capsule 5  . haloperidol (HALDOL) 2 MG tablet 1  qhs 90 tablet 2  . HYDROcodone-acetaminophen (NORCO/VICODIN) 5-325 MG tablet Take 1 tablet by mouth every 6 (six) hours as needed for moderate pain. 20 tablet 0  . losartan-hydrochlorothiazide (HYZAAR) 100-12.5 MG tablet TAKE 1 TABLET EVERY DAY 90 tablet 2  . rosuvastatin (CRESTOR) 20 MG tablet Take 1 tablet (20 mg total) by mouth daily. 90 tablet 1  . sildenafil (VIAGRA) 100 MG tablet Take 0.5-1 tablets (50-100 mg total) by mouth daily as needed for erectile dysfunction. 5 tablet 11  . tiotropium (SPIRIVA) 18 MCG inhalation capsule Place 1 capsule (18 mcg total) into inhaler and inhale daily. 30 capsule 5  . traZODone (DESYREL) 100 MG tablet 4 qhs  May add a third pill 120 tablet 10   No current facility-administered medications for this visit.     Lab Results: No results found for this or any previous visit (from the past 48 hour(s)).  Physical Findings: AIMS:  , ,  ,  ,    CIWA:    COWS:     Treatment Plan Summary: At this time we will increase his trazodone to the maximum dose 400 mg. He'll continue taking 40 mg of Prozac. He'll continue taking Neurontin 400 mg 1 in the morning 2 at night. We'll clarify that he is back to taking Haldol 2 mg every night. I do believe the patient is doing fairly well. I do not think he is acutely suicidal. He's got good family support. His brothers and sisters around him. He denies the use of alcohol or drugs. He'll return to see me in approximately 3 or 4 months. The patient was told that if the continues feeling depressed longer than the next month or 2 that he should call back. I  do not think this patient is feeling dysfunction do not think he is suicidal or homicidal.     Gypsy Balsam 06/29/2016, 3:11 PM

## 2016-10-06 DIAGNOSIS — M2578 Osteophyte, vertebrae: Secondary | ICD-10-CM | POA: Diagnosis not present

## 2016-10-06 DIAGNOSIS — F39 Unspecified mood [affective] disorder: Secondary | ICD-10-CM | POA: Diagnosis not present

## 2016-10-06 DIAGNOSIS — M542 Cervicalgia: Secondary | ICD-10-CM | POA: Diagnosis not present

## 2016-10-06 DIAGNOSIS — M47892 Other spondylosis, cervical region: Secondary | ICD-10-CM | POA: Diagnosis not present

## 2016-10-06 DIAGNOSIS — G8929 Other chronic pain: Secondary | ICD-10-CM | POA: Diagnosis not present

## 2016-10-06 DIAGNOSIS — F17210 Nicotine dependence, cigarettes, uncomplicated: Secondary | ICD-10-CM | POA: Diagnosis not present

## 2016-10-06 DIAGNOSIS — I10 Essential (primary) hypertension: Secondary | ICD-10-CM | POA: Diagnosis not present

## 2016-10-06 DIAGNOSIS — M25512 Pain in left shoulder: Secondary | ICD-10-CM | POA: Diagnosis not present

## 2016-11-02 ENCOUNTER — Ambulatory Visit (HOSPITAL_COMMUNITY): Payer: Self-pay | Admitting: Psychiatry

## 2016-11-04 DIAGNOSIS — G8929 Other chronic pain: Secondary | ICD-10-CM | POA: Diagnosis not present

## 2016-11-04 DIAGNOSIS — M7552 Bursitis of left shoulder: Secondary | ICD-10-CM | POA: Diagnosis not present

## 2016-11-04 DIAGNOSIS — M67912 Unspecified disorder of synovium and tendon, left shoulder: Secondary | ICD-10-CM | POA: Diagnosis not present

## 2016-11-04 DIAGNOSIS — M5090 Cervical disc disorder, unspecified, unspecified cervical region: Secondary | ICD-10-CM | POA: Diagnosis not present

## 2016-11-04 DIAGNOSIS — M25512 Pain in left shoulder: Secondary | ICD-10-CM | POA: Diagnosis not present

## 2016-11-04 DIAGNOSIS — R29898 Other symptoms and signs involving the musculoskeletal system: Secondary | ICD-10-CM | POA: Diagnosis not present

## 2016-11-04 DIAGNOSIS — M542 Cervicalgia: Secondary | ICD-10-CM | POA: Diagnosis not present

## 2016-11-04 DIAGNOSIS — M792 Neuralgia and neuritis, unspecified: Secondary | ICD-10-CM | POA: Diagnosis not present

## 2016-11-14 DIAGNOSIS — M25519 Pain in unspecified shoulder: Secondary | ICD-10-CM | POA: Diagnosis not present

## 2016-11-14 DIAGNOSIS — M5090 Cervical disc disorder, unspecified, unspecified cervical region: Secondary | ICD-10-CM | POA: Diagnosis not present

## 2016-11-14 DIAGNOSIS — R29898 Other symptoms and signs involving the musculoskeletal system: Secondary | ICD-10-CM | POA: Diagnosis not present

## 2016-11-14 DIAGNOSIS — M792 Neuralgia and neuritis, unspecified: Secondary | ICD-10-CM | POA: Diagnosis not present

## 2016-11-14 DIAGNOSIS — M542 Cervicalgia: Secondary | ICD-10-CM | POA: Diagnosis not present

## 2016-11-14 DIAGNOSIS — M47892 Other spondylosis, cervical region: Secondary | ICD-10-CM | POA: Diagnosis not present

## 2016-11-15 ENCOUNTER — Other Ambulatory Visit (HOSPITAL_COMMUNITY): Payer: Self-pay | Admitting: Psychiatry

## 2016-11-15 DIAGNOSIS — F323 Major depressive disorder, single episode, severe with psychotic features: Secondary | ICD-10-CM

## 2016-11-22 DIAGNOSIS — M25512 Pain in left shoulder: Secondary | ICD-10-CM | POA: Diagnosis not present

## 2016-11-22 DIAGNOSIS — G8929 Other chronic pain: Secondary | ICD-10-CM | POA: Diagnosis not present

## 2016-11-22 DIAGNOSIS — M67912 Unspecified disorder of synovium and tendon, left shoulder: Secondary | ICD-10-CM | POA: Diagnosis not present

## 2016-11-22 DIAGNOSIS — M7552 Bursitis of left shoulder: Secondary | ICD-10-CM | POA: Diagnosis not present

## 2016-11-30 ENCOUNTER — Other Ambulatory Visit (HOSPITAL_COMMUNITY): Payer: Self-pay

## 2016-11-30 DIAGNOSIS — F323 Major depressive disorder, single episode, severe with psychotic features: Secondary | ICD-10-CM

## 2016-11-30 MED ORDER — GABAPENTIN 400 MG PO CAPS: ORAL_CAPSULE | ORAL | 1 refills | Status: DC

## 2016-12-28 ENCOUNTER — Encounter (HOSPITAL_COMMUNITY): Payer: Self-pay | Admitting: Psychiatry

## 2016-12-28 ENCOUNTER — Ambulatory Visit (INDEPENDENT_AMBULATORY_CARE_PROVIDER_SITE_OTHER): Payer: Medicare HMO | Admitting: Psychiatry

## 2016-12-28 VITALS — BP 126/82 | HR 92 | Ht 69.0 in | Wt 211.0 lb

## 2016-12-28 DIAGNOSIS — F323 Major depressive disorder, single episode, severe with psychotic features: Secondary | ICD-10-CM

## 2016-12-28 DIAGNOSIS — F17210 Nicotine dependence, cigarettes, uncomplicated: Secondary | ICD-10-CM

## 2016-12-28 DIAGNOSIS — Z79899 Other long term (current) drug therapy: Secondary | ICD-10-CM

## 2016-12-28 DIAGNOSIS — F325 Major depressive disorder, single episode, in full remission: Secondary | ICD-10-CM

## 2016-12-28 DIAGNOSIS — F419 Anxiety disorder, unspecified: Secondary | ICD-10-CM | POA: Diagnosis not present

## 2016-12-28 MED ORDER — GABAPENTIN 400 MG PO CAPS: ORAL_CAPSULE | ORAL | 8 refills | Status: DC

## 2016-12-28 MED ORDER — TRAZODONE HCL 100 MG PO TABS: ORAL_TABLET | ORAL | 10 refills | Status: DC

## 2016-12-28 MED ORDER — FLUOXETINE HCL 20 MG PO CAPS: ORAL_CAPSULE | ORAL | 1 refills | Status: DC

## 2016-12-28 MED ORDER — HALOPERIDOL 2 MG PO TABS: ORAL_TABLET | ORAL | 2 refills | Status: DC

## 2016-12-28 NOTE — Progress Notes (Signed)
Patient ID: Gabriel Miller, male   DOB: 1961/01/02, 56 y.o.   MRN: [id number] [facility] MD Progress Note  12/28/2016 3:12 PM CAULDER WEHNER  MRN:  [id number] Subjective:  Principal Problem: Major depression with psychosis Diagnosis:  Major depression with psychosis Today the patient is actually doing well. He seems to agree and ovaries mother's death fairly well he still lives with his brother who is disabled. The patient still takes about his mother now and then but he seems to got over patient is planning to spend a few weeks living with his son Sandre Kitty. In [place] forgets doses since which is something he likes a lot. There also be with his 3 grandchildren. He seems to be functioning fairly well. His hygiene seems to be poor. The patient has a girlfriend that he sees once a month. He denies daily depression. He sleeping and eating very well. He's got good energy. Is no problems concentrating. He watches TV and has a social group. He takes medicines just as prescribed. He drinks no alcohol takes no drugs. It is noted that he was incarcerated for 2 years up in 2 2006. He's been taking a stable dose of medicines done very well. He has a drivers license is active and feels happy with life. Narcotics to discontinue his Haldol he began to feel unusual uncomfortable his last visit. We returned from the Haldol back to 2 mg. He no longer feel strange and uncomfortable. He feels back to himself.  Past Medical History:  Past Medical History:  Diagnosis Date  . Alcohol abuse   . Depression   . GERD (gastroesophageal reflux disease)   . Hypertension     Past Surgical History:  Procedure Laterality Date  . MIDDLE EAR SURGERY  1990   Family History:  Family History  Problem Relation Age of Onset  . Arthritis Mother   . Heart disease Mother   . Hypertension Mother   . Diabetes Mother   . COPD Mother   . Alcohol abuse Father   . Hypertension Father   . Diabetes Father   . Heart attack Father   .  Hypertension Maternal Aunt   . Cancer Maternal Aunt        breast  . Cancer Maternal Grandmother        breast  . Diabetes Maternal Grandmother   . Diabetes Maternal Grandfather   . Diabetes Paternal Grandfather    Social History:  History  Alcohol Use No    Comment: drinks half-gallon liquor once a week.     History  Drug Use No    Social History   Social History  . Marital status: Single    Spouse name: N/A  . Number of children: N/A  . Years of education: N/A   Social History Main Topics  . Smoking status: Current Every Day Smoker    Packs/day: 1.00    Years: 20.00    Types: Cigarettes  . Smokeless tobacco: Never Used     Comment: Has cut back to 1/2 pack and working on [employer]   . Alcohol use No     Comment: drinks half-gallon liquor once a week.  . Drug use: No  . Sexual activity: Not Currently   Other Topics Concern  . None   Social History Narrative  . None   Additional History:    Sleep: Good  Appetite:  Good   Assessment:   Musculoskeletal: Strength & Muscle Tone: within normal limits Gait & Station: normal Patient  leans: Right   Psychiatric Specialty Exam: Physical Exam  ROS  Blood pressure 126/82, pulse 92, height  (1.753 m), weight 211 lb (95.7 kg), SpO2 95 %.Body mass index is 31.16 kg/m.  General Appearance: NA  Eye Contact::  Good  Speech:  Normal Rate  Volume:  Normal  Mood:  NA  Affect:  Appropriate  Thought Process:  Goal Directed  Orientation:  Full (Time, Place, and Person)  Thought Content:  WDL  Suicidal Thoughts:  No  Homicidal Thoughts:  No  Memory:  NA  Judgement:  Good  Insight:  Good  Psychomotor Activity:  Normal  Concentration:  Good  Recall:  Good  Fund of Knowledge:Good  Language: Poor  Akathisia:  No  Handed:  Right  AIMS (if indicated):     Assets:  Desire for Improvement  ADL's:  Intact  Cognition: WNL  Sleep:        Current Medications: Current Outpatient Prescriptions  Medication Sig  Dispense Refill  . FLUoxetine (PROZAC) 20 MG capsule 2 qam 180 capsule 1  . gabapentin (NEURONTIN) 400 MG capsule 1 Po qam and 3 po qhs 120 capsule 8  . haloperidol (HALDOL) 2 MG tablet 1  qhs 90 tablet 2  . losartan-hydrochlorothiazide (HYZAAR) 100-12.5 MG tablet TAKE 1 TABLET EVERY DAY 90 tablet 2  . rosuvastatin (CRESTOR) 20 MG tablet Take 1 tablet (20 mg total) by mouth daily. 90 tablet 1  . traZODone (DESYREL) 100 MG tablet 3 qhs  May add a third pill 90 tablet 10  . albuterol (PROVENTIL) (5 MG/ML) 0.5% nebulizer solution Take 0.5 mLs (2.5 mg total) by nebulization every 2 (two) hours as needed for wheezing or shortness of breath. 20 mL 0  . fenofibrate 160 MG tablet Take 1 tablet (160 mg total) by mouth daily. (Patient not taking: Reported on 12/28/2016) 90 tablet 3  . HYDROcodone-acetaminophen (NORCO/VICODIN) 5-325 MG tablet Take 1 tablet by mouth every 6 (six) hours as needed for moderate pain. (Patient not taking: Reported on 12/28/2016) 20 tablet 0  . sildenafil (VIAGRA) 100 MG tablet Take 0.5-1 tablets (50-100 mg total) by mouth daily as needed for erectile dysfunction. (Patient not taking: Reported on 12/28/2016) 5 tablet 11  . tiotropium (SPIRIVA) 18 MCG inhalation capsule Place 1 capsule (18 mcg total) into inhaler and inhale daily. 30 capsule 5   No current facility-administered medications for this visit.     Lab Results: No results found for this or any previous visit (from the past 48 hour(s)).  Physical Findings: AIMS:  , ,  ,  ,    CIWA:    COWS:     Treatment Plan Summary: At this time the patient will continue taking Haldol 2 mg he demonstrates no tardive dyskinesia. He is medically very healthy. He has a medical doctor but rarely sees. He's adjust his trazodone to taking 300 mg which works well. He continues taking Neurontin 400 mg 1 in the morning and 3 at night. I started this for anxiety seems to work fairly well. He still takes Prozac 40 mg. Patient is very stable.  He wants to continue on these medications. He is very compliant and reliable. He denies chest pain or shortness of breath or any neurological symptoms he is functioning very well. He'll return to see me in 5 months    Gypsy Balsam 12/28/2016, 3:12 PM

## 2017-01-06 ENCOUNTER — Other Ambulatory Visit (HOSPITAL_COMMUNITY): Payer: Self-pay | Admitting: Psychiatry

## 2017-01-10 ENCOUNTER — Other Ambulatory Visit (HOSPITAL_COMMUNITY): Payer: Self-pay

## 2017-01-10 DIAGNOSIS — F323 Major depressive disorder, single episode, severe with psychotic features: Secondary | ICD-10-CM

## 2017-01-10 MED ORDER — TRAZODONE HCL 100 MG PO TABS: ORAL_TABLET | ORAL | 10 refills | Status: DC

## 2017-01-10 MED ORDER — FLUOXETINE HCL 20 MG PO CAPS: ORAL_CAPSULE | ORAL | 1 refills | Status: DC

## 2017-01-10 MED ORDER — HALOPERIDOL 2 MG PO TABS: ORAL_TABLET | ORAL | 2 refills | Status: DC

## 2017-01-10 MED ORDER — GABAPENTIN 400 MG PO CAPS: ORAL_CAPSULE | ORAL | 8 refills | Status: DC

## 2017-02-17 ENCOUNTER — Other Ambulatory Visit: Payer: Self-pay | Admitting: Family Medicine

## 2017-03-13 ENCOUNTER — Telehealth (HOSPITAL_COMMUNITY): Payer: Self-pay

## 2017-03-13 ENCOUNTER — Other Ambulatory Visit (HOSPITAL_COMMUNITY): Payer: Self-pay

## 2017-03-13 MED ORDER — HALOPERIDOL 5 MG PO TABS: ORAL_TABLET | ORAL | 1 refills | Status: DC

## 2017-03-13 NOTE — Telephone Encounter (Signed)
Medication problem - Fax received from pt's [facility] requesting an alternative medication from Haldol 2 mg as it is on backorder.

## 2017-04-25 ENCOUNTER — Other Ambulatory Visit (HOSPITAL_COMMUNITY): Payer: Self-pay

## 2017-04-25 MED ORDER — HALOPERIDOL 5 MG PO TABS: 5.0000 mg | ORAL_TABLET | Freq: Every day | ORAL | 1 refills | Status: DC

## 2017-06-02 ENCOUNTER — Ambulatory Visit (HOSPITAL_COMMUNITY): Payer: Self-pay | Admitting: Psychiatry

## 2017-06-07 ENCOUNTER — Ambulatory Visit (HOSPITAL_COMMUNITY): Payer: Medicare HMO | Admitting: Psychiatry

## 2017-06-16 ENCOUNTER — Ambulatory Visit (HOSPITAL_COMMUNITY): Payer: Medicare HMO | Admitting: Psychiatry

## 2017-06-16 ENCOUNTER — Encounter (HOSPITAL_COMMUNITY): Payer: Self-pay | Admitting: Psychiatry

## 2017-06-16 DIAGNOSIS — F17210 Nicotine dependence, cigarettes, uncomplicated: Secondary | ICD-10-CM

## 2017-06-16 DIAGNOSIS — Z811 Family history of alcohol abuse and dependence: Secondary | ICD-10-CM

## 2017-06-16 DIAGNOSIS — F323 Major depressive disorder, single episode, severe with psychotic features: Secondary | ICD-10-CM | POA: Diagnosis not present

## 2017-06-16 DIAGNOSIS — F325 Major depressive disorder, single episode, in full remission: Secondary | ICD-10-CM

## 2017-06-16 MED ORDER — HALOPERIDOL 5 MG PO TABS: 5.0000 mg | ORAL_TABLET | Freq: Every day | ORAL | 1 refills | Status: DC

## 2017-06-16 MED ORDER — GABAPENTIN 400 MG PO CAPS: ORAL_CAPSULE | ORAL | 8 refills | Status: DC

## 2017-06-16 MED ORDER — SUVOREXANT 20 MG PO TABS: 20.0000 mg | ORAL_TABLET | Freq: Every evening | ORAL | 5 refills | Status: DC

## 2017-06-16 NOTE — Progress Notes (Signed)
Patient ID: Gabriel Miller, male   DOB: 09/03/1960, 57 y.o.   MRN: [id number] [facility] MD Progress Note  06/16/2017 10:15 AM Gabriel Miller  MRN:  [id number] Subjective:  Principal Problem: Major depression with psychosis Diagnosis:  Major depression with psychosis Today the patient is functioning very well he still lives with his brother. He has no psychosis. He continues taking all medicines except that she's not sleeping very well at all. He has a hard time falling off to sleep. The trazodone has not helped. He takes Xanax during the day. Physically dysfunctional 4. He continues taking Neurontin 400 mg in the morning and 3 at night. The patient shows no evidence of tardive dyskinesia. He is not psychotic in any way. He's actually well-dressed clean engaging. He seems friendly and calm. He denies the use of alcohol or drugs. He's actually functioning very well. His biggest complaint is lack of sleep  Past Medical History:  Past Medical History:  Diagnosis Date  . Alcohol abuse   . Depression   . GERD (gastroesophageal reflux disease)   . Hypertension     Past Surgical History:  Procedure Laterality Date  . MIDDLE EAR SURGERY  1990   Family History:  Family History  Problem Relation Age of Onset  . Arthritis Mother   . Heart disease Mother   . Hypertension Mother   . Diabetes Mother   . COPD Mother   . Alcohol abuse Father   . Hypertension Father   . Diabetes Father   . Heart attack Father   . Hypertension Maternal Aunt   . Cancer Maternal Aunt        breast  . Cancer Maternal Grandmother        breast  . Diabetes Maternal Grandmother   . Diabetes Maternal Grandfather   . Diabetes Paternal Grandfather    Social History:  Social History   Substance and Sexual Activity  Alcohol Use No   Comment: drinks half-gallon liquor once a week.     Social History   Substance and Sexual Activity  Drug Use No    Social History   Socioeconomic History  . Marital status: Single     Spouse name: Not on file  . Number of children: Not on file  . Years of education: Not on file  . Highest education level: Not on file  Occupational History  . Not on file  Social Needs  . Financial resource strain: Not on file  . Food insecurity:    Worry: Not on file    Inability: Not on file  . Transportation needs:    Medical: Not on file    Non-medical: Not on file  Tobacco Use  . Smoking status: Current Every Day Smoker    Packs/day: 1.00    Years: 20.00    Pack years: 20.00    Types: Cigarettes  . Smokeless tobacco: Never Used  . Tobacco comment: Has cut back to 1/2 pack and working on quiting   Substance and Sexual Activity  . Alcohol use: No    Comment: drinks half-gallon liquor once a week.  . Drug use: No  . Sexual activity: Not Currently  Lifestyle  . Physical activity:    Days per week: Not on file    Minutes per session: Not on file  . Stress: Not on file  Relationships  . Social connections:    Talks on phone: Not on file    Gets together: Not on file  Attends religious service: Not on file    Active member of club or organization: Not on file    Attends meetings of clubs or organizations: Not on file    Relationship status: Not on file  Other Topics Concern  . Not on file  Social History Narrative  . Not on file   Additional History:    Sleep: Good  Appetite:  Good   Assessment:   Musculoskeletal: Strength & Muscle Tone: within normal limits Gait & Station: normal Patient leans: Right   Psychiatric Specialty Exam: Physical Exam  ROS  Blood pressure 130/78, pulse 68, height 5\' 9"  (1.753 m), weight 199 lb (90.3 kg).Body mass index is 29.39 kg/m.  General Appearance: NA  Eye Contact::  Good  Speech:  Normal Rate  Volume:  Normal  Mood:  NA  Affect:  Appropriate  Thought Process:  Goal Directed  Orientation:  Full (Time, Place, and Person)  Thought Content:  WDL  Suicidal Thoughts:  No  Homicidal Thoughts:  No  Memory:  NA   Judgement:  Good  Insight:  Good  Psychomotor Activity:  Normal  Concentration:  Good  Recall:  Good  Fund of Knowledge:Good  Language: Poor  Akathisia:  No  Handed:  Right  AIMS (if indicated):     Assets:  Desire for Improvement  ADL's:  Intact  Cognition: WNL  Sleep:        Current Medications: Current Outpatient Medications  Medication Sig Dispense Refill  . FLUoxetine (PROZAC) 20 MG capsule 2 qam 180 capsule 1  . gabapentin (NEURONTIN) 400 MG capsule 1 Po qam and 3 po qhs 120 capsule 8  . haloperidol (HALDOL) 5 MG tablet Take 1 tablet (5 mg total) by mouth at bedtime. 30 tablet 1  . losartan-hydrochlorothiazide (HYZAAR) 100-12.5 MG tablet TAKE 1 TABLET EVERY DAY 90 tablet 1  . rosuvastatin (CRESTOR) 20 MG tablet Take 1 tablet (20 mg total) by mouth daily. 90 tablet 1  . traZODone (DESYREL) 100 MG tablet 3 qhs  May add a third pill 90 tablet 10  . albuterol (PROVENTIL) (5 MG/ML) 0.5% nebulizer solution Take 0.5 mLs (2.5 mg total) by nebulization every 2 (two) hours as needed for wheezing or shortness of breath. 20 mL 0  . fenofibrate 160 MG tablet Take 1 tablet (160 mg total) by mouth daily. (Patient not taking: Reported on 12/28/2016) 90 tablet 3  . HYDROcodone-acetaminophen (NORCO/VICODIN) 5-325 MG tablet Take 1 tablet by mouth every 6 (six) hours as needed for moderate pain. (Patient not taking: Reported on 12/28/2016) 20 tablet 0  . sildenafil (VIAGRA) 100 MG tablet Take 0.5-1 tablets (50-100 mg total) by mouth daily as needed for erectile dysfunction. (Patient not taking: Reported on 12/28/2016) 5 tablet 11  . Suvorexant (BELSOMRA) 20 MG TABS Take 20 mg by mouth Nightly. 30 tablet 5  . tiotropium (SPIRIVA) 18 MCG inhalation capsule Place 1 capsule (18 mcg total) into inhaler and inhale daily. 30 capsule 5   No current facility-administered medications for this visit.     Lab Results: No results found for this or any previous visit (from the past 48 hour(s)).  Physical  Findings: AIMS:  , ,  ,  ,    CIWA:    COWS:     Treatment Plan Summary: Today we she'll begin the patient on Belsomra 20 mg. He she'll discontinue the trazodone. He'll continue taking Haldol 2 mg probably will be used indefinitely. Attempts to discontinue that led to problems continue taking  Neurontin 400 mg 1 in the morning and 3 at night to use taking Prozac 40 mg area patient denies any chest pain or shortness of breath. He is in good spirits. He does have somewhat of a social life so he is engaging. He'll return to see me in 4 months  Gypsy Balsam 06/16/2017, 10:15 AM

## 2017-07-07 ENCOUNTER — Other Ambulatory Visit (HOSPITAL_COMMUNITY): Payer: Self-pay | Admitting: Psychiatry

## 2017-08-30 ENCOUNTER — Other Ambulatory Visit (HOSPITAL_COMMUNITY): Payer: Self-pay

## 2017-08-30 MED ORDER — HALOPERIDOL 5 MG PO TABS: 5.0000 mg | ORAL_TABLET | Freq: Every day | ORAL | 0 refills | Status: DC

## 2017-09-03 IMAGING — DX DG SHOULDER 2+V*L*
3 series · 3 of 3 positions shown · non-contrast
Comparison: None.

CLINICAL DATA: Fall 2 weeks ago. Left shoulder pain since. Limited
range of motion.

EXAM:
LEFT SHOULDER - 2+ VIEW

[grashey]
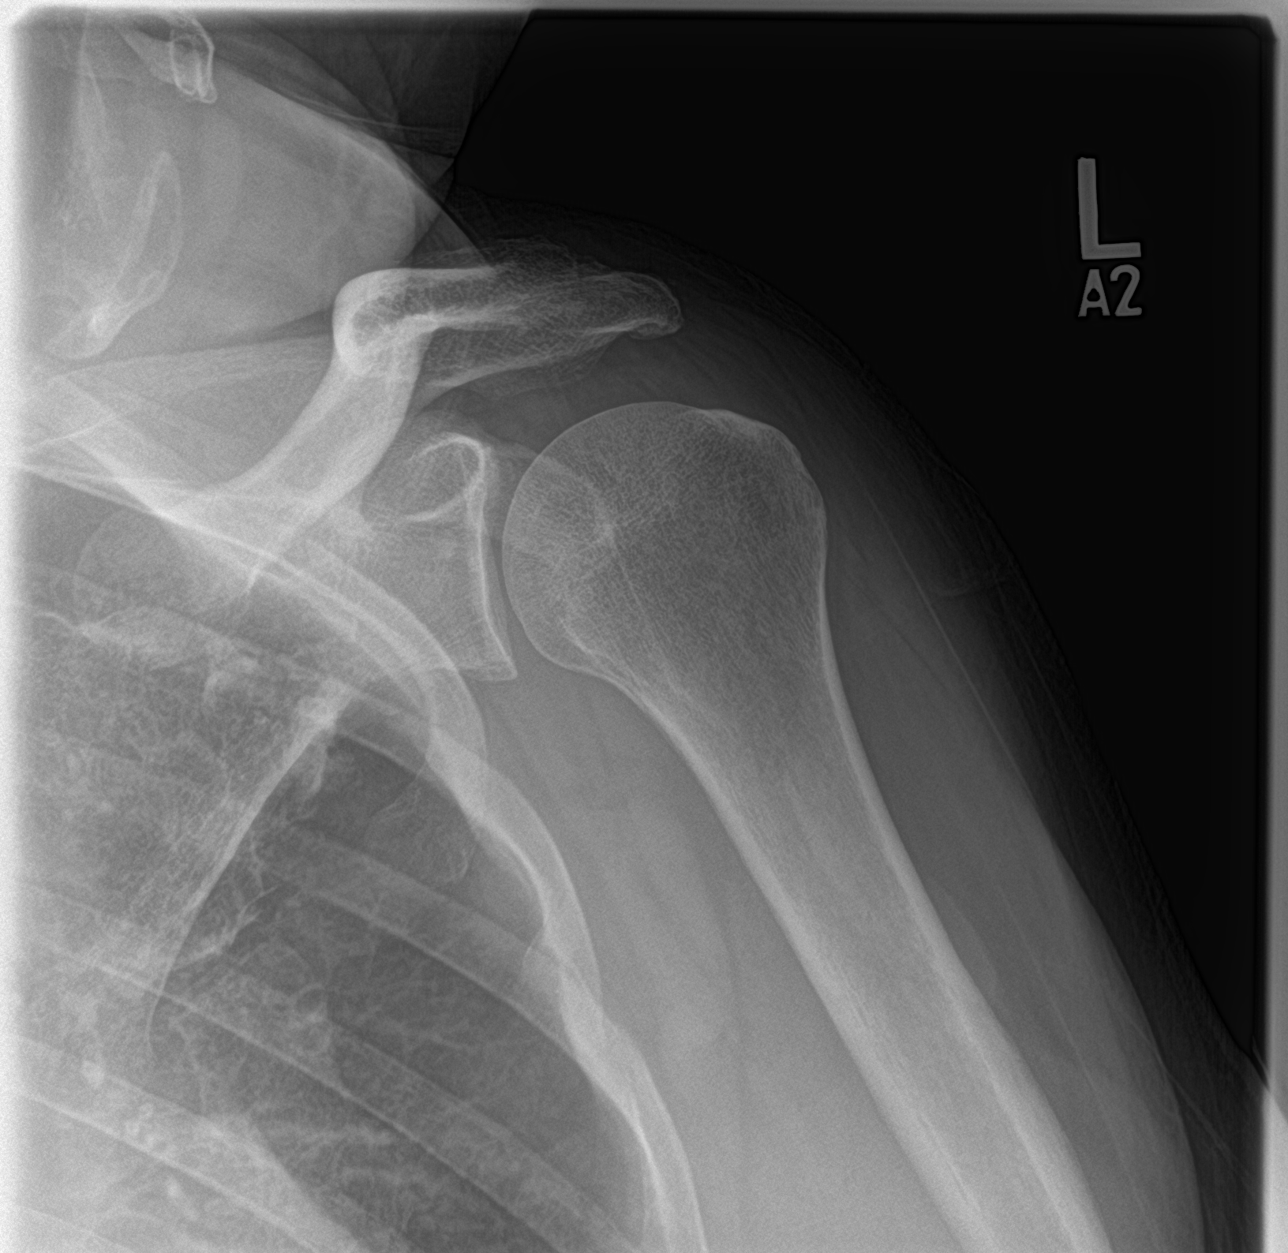

[y view]
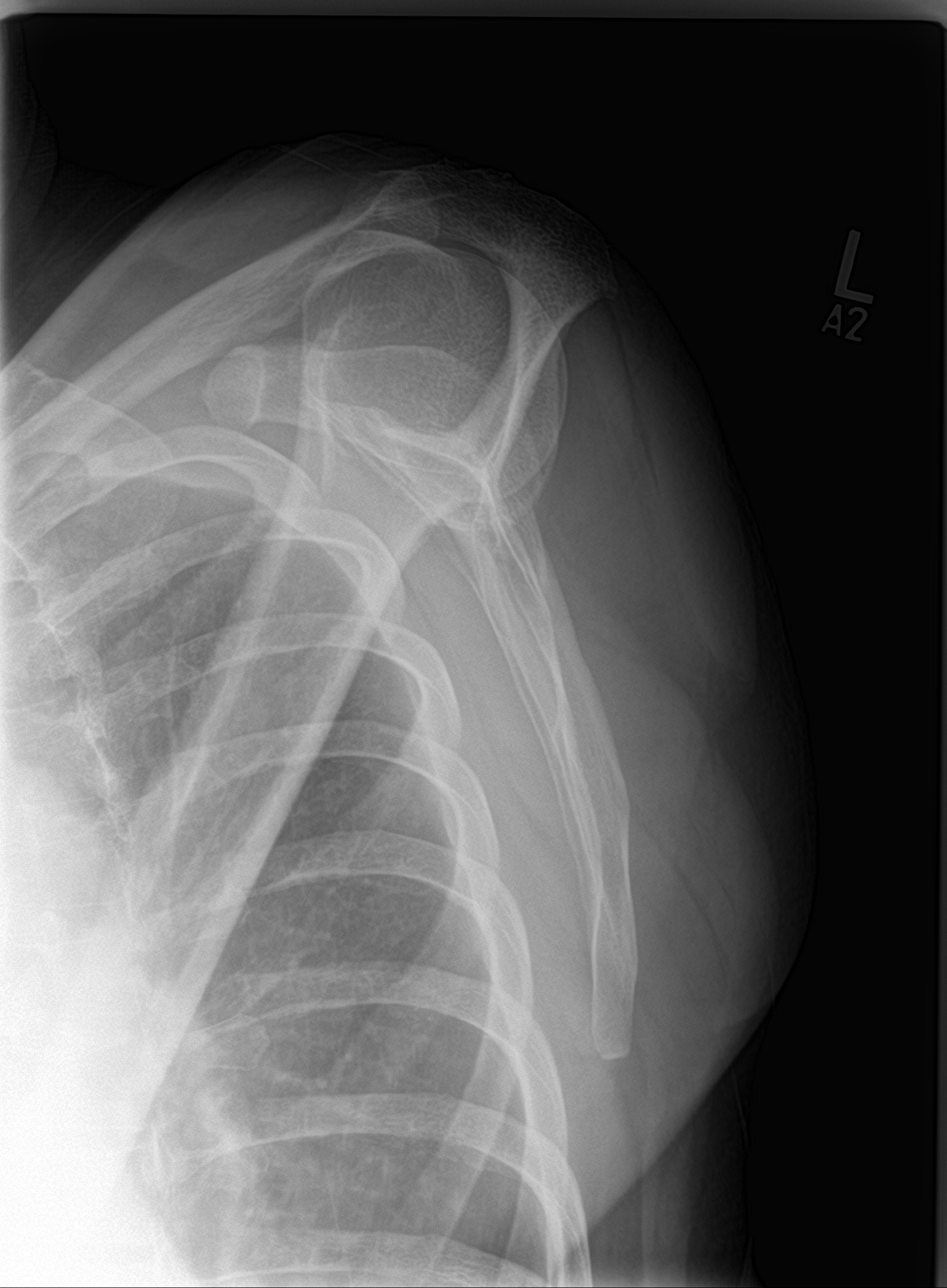

[shoulder axial]
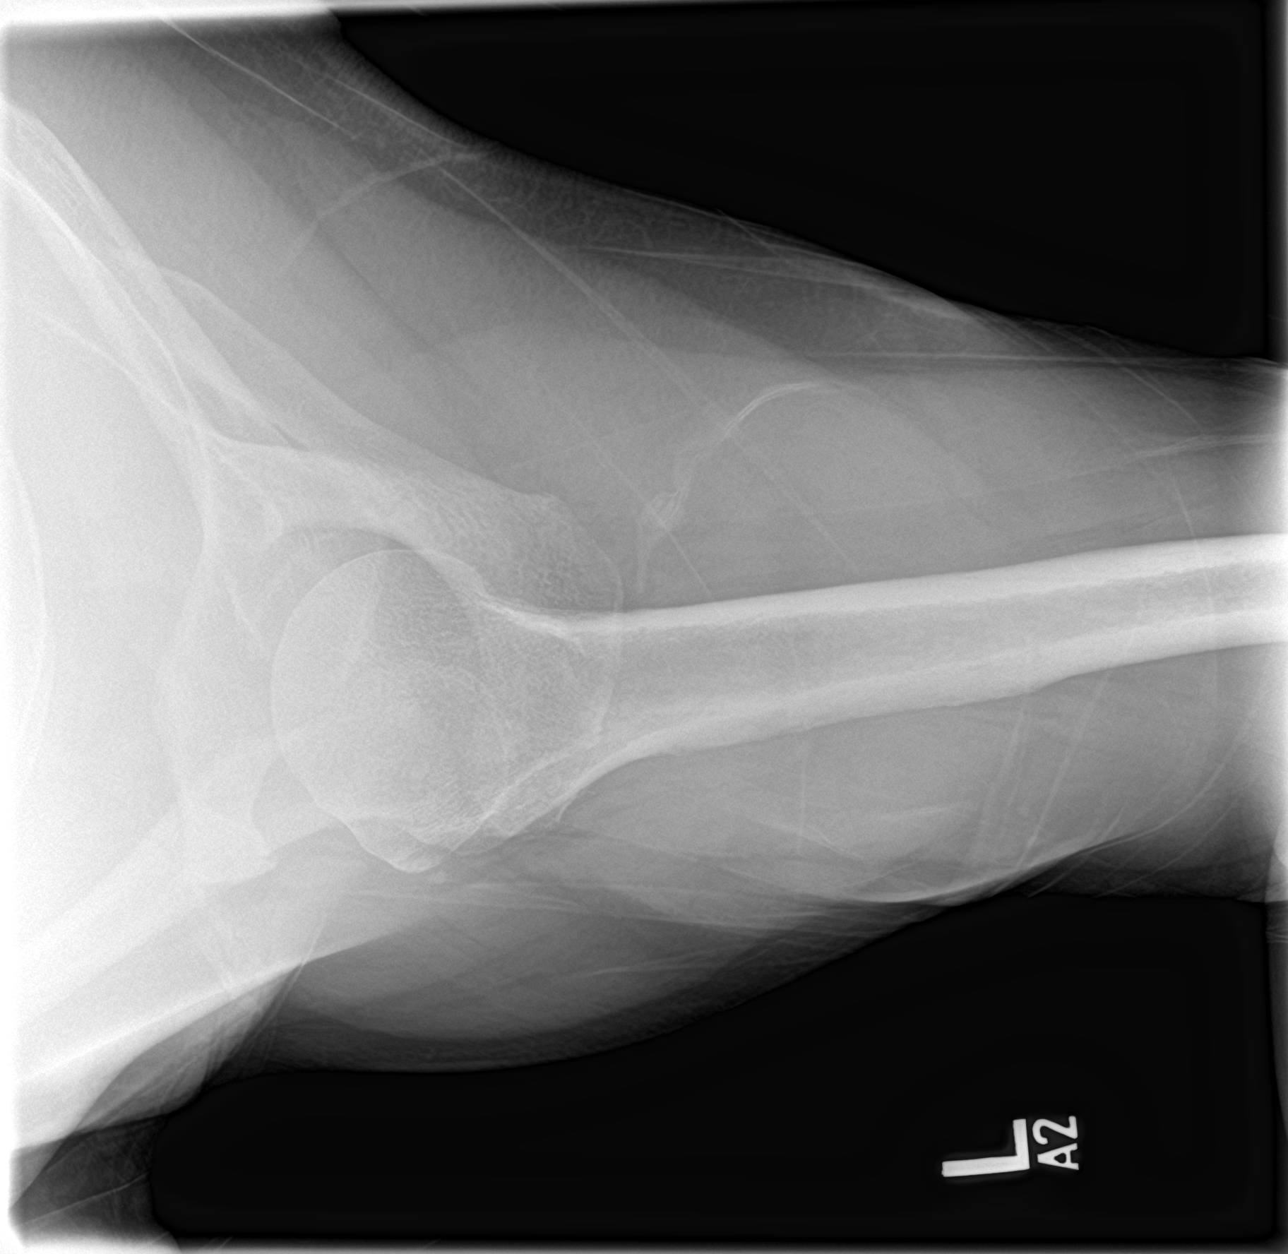

[3 of 3 positions shown; findings below may reference images not displayed]

FINDINGS: No fracture.  No bone lesion.

The AC and glenohumeral joints are normally spaced and aligned.
There are minor AC joint osteoarthritic changes.

Soft tissues are unremarkable.
IMPRESSION: No fracture or dislocation.  Minor AC joint osteoarthritis.

## 2017-09-05 ENCOUNTER — Other Ambulatory Visit: Payer: Self-pay | Admitting: Family Medicine

## 2017-10-06 ENCOUNTER — Ambulatory Visit (HOSPITAL_COMMUNITY): Payer: Self-pay | Admitting: Psychiatry

## 2017-10-13 ENCOUNTER — Other Ambulatory Visit: Payer: Self-pay | Admitting: Family Medicine

## 2017-10-13 NOTE — Telephone Encounter (Signed)
Copied from CRM [phone]#[id number]. Topic: Quick Communication - Rx Refill/Question >> Oct 13, 2017  2:27 PM Burchel, Abbi R wrote: Medication: losartan-hydrochlorothiazide (HYZAAR) 100-12.5 MG tablet  Preferred Pharmacy: [facility] [phone] [place] - [address] [facility] [address] [facility] [address] [place] [id number] Phone: [phone] Fax: [phone]    Pt was advised that RX refills may take up to 3 business days. Pt is completely out of rx.

## 2017-10-16 ENCOUNTER — Other Ambulatory Visit: Payer: Self-pay | Admitting: Family Medicine

## 2017-10-16 MED ORDER — LOSARTAN POTASSIUM-HCTZ 100-12.5 MG PO TABS: 1.0000 | ORAL_TABLET | Freq: Every day | ORAL | 0 refills | Status: DC

## 2017-10-16 NOTE — Telephone Encounter (Signed)
Patient called, unable to leave VM no voice mail set up.   Hyzaar refill Last OV:02/23/16, no upcoming appointment Last refill:08/26/17 30 tab/0 refill ZOX:[id number]:[place] [facility]: [facility] [phone]#[id number] - [address], [place] - [address] [facility] [address] [facility] [address] [phone] (Phone) [phone] (Fax)

## 2017-10-24 ENCOUNTER — Ambulatory Visit (INDEPENDENT_AMBULATORY_CARE_PROVIDER_SITE_OTHER): Payer: Medicare HMO | Admitting: Family Medicine

## 2017-10-24 ENCOUNTER — Encounter: Payer: Self-pay | Admitting: Family Medicine

## 2017-10-24 ENCOUNTER — Ambulatory Visit (INDEPENDENT_AMBULATORY_CARE_PROVIDER_SITE_OTHER): Payer: Medicare HMO

## 2017-10-24 VITALS — BP 100/60 | HR 93 | Temp 98.1°F | Wt 196.2 lb

## 2017-10-24 DIAGNOSIS — F17200 Nicotine dependence, unspecified, uncomplicated: Secondary | ICD-10-CM

## 2017-10-24 DIAGNOSIS — J42 Unspecified chronic bronchitis: Secondary | ICD-10-CM | POA: Diagnosis not present

## 2017-10-24 DIAGNOSIS — E785 Hyperlipidemia, unspecified: Secondary | ICD-10-CM | POA: Diagnosis not present

## 2017-10-24 DIAGNOSIS — L942 Calcinosis cutis: Secondary | ICD-10-CM

## 2017-10-24 DIAGNOSIS — I10 Essential (primary) hypertension: Secondary | ICD-10-CM

## 2017-10-24 DIAGNOSIS — N182 Chronic kidney disease, stage 2 (mild): Secondary | ICD-10-CM | POA: Diagnosis not present

## 2017-10-24 DIAGNOSIS — M79671 Pain in right foot: Secondary | ICD-10-CM

## 2017-10-24 DIAGNOSIS — M19071 Primary osteoarthritis, right ankle and foot: Secondary | ICD-10-CM | POA: Diagnosis not present

## 2017-10-24 LAB — COMPREHENSIVE METABOLIC PANEL
ALBUMIN: 4.7 g/dL (ref 3.5–5.2)
ALT: 38 U/L (ref 0–53)
AST: 22 U/L (ref 0–37)
Alkaline Phosphatase: 73 U/L (ref 39–117)
BUN: 17 mg/dL (ref 6–23)
CO2: 31 mEq/L (ref 19–32)
CREATININE: 1.51 mg/dL — AB (ref 0.40–1.50)
Calcium: 10.7 mg/dL — ABNORMAL HIGH (ref 8.4–10.5)
Chloride: 97 mEq/L (ref 96–112)
GFR: 51.01 mL/min — ABNORMAL LOW (ref >60.00)
GLUCOSE: 123 mg/dL — AB (ref 70–99)
POTASSIUM: 3.7 meq/L (ref 3.5–5.1)
SODIUM: 139 meq/L (ref 135–145)
TOTAL PROTEIN: 7.3 g/dL (ref 6.0–8.3)
Total Bilirubin: 1.2 mg/dL (ref 0.2–1.2)

## 2017-10-24 LAB — CBC WITH DIFFERENTIAL/PLATELET
Basophils Absolute: 0.1 10*3/uL (ref 0.0–0.1)
Basophils Relative: 0.7 % (ref 0.0–3.0)
EOS ABS: 0.2 10*3/uL (ref 0.0–0.7)
EOS PCT: 2.4 % (ref 0.0–5.0)
HCT: 52 % (ref 39.0–52.0)
HEMOGLOBIN: 17.9 g/dL — AB (ref 13.0–17.0)
LYMPHS ABS: 1.8 10*3/uL (ref 0.7–4.0)
Lymphocytes Relative: 21.1 % (ref 12.0–46.0)
MCHC: 34.4 g/dL (ref 30.0–36.0)
MCV: 89.6 fl (ref 78.0–100.0)
MONO ABS: 0.7 10*3/uL (ref 0.1–1.0)
Monocytes Relative: 8.1 % (ref 3.0–12.0)
Neutro Abs: 5.9 10*3/uL (ref 1.4–7.7)
Neutrophils Relative %: 67.7 % (ref 43.0–77.0)
Platelets: 282 10*3/uL (ref 150.0–400.0)
RBC: 5.81 Mil/uL (ref 4.22–5.81)
RDW: 15.1 % (ref 11.5–15.5)
WBC: 8.6 10*3/uL (ref 4.0–10.5)

## 2017-10-24 LAB — LDL CHOLESTEROL, DIRECT: LDL DIRECT: 198 mg/dL

## 2017-10-24 LAB — LIPID PANEL
Cholesterol: 283 mg/dL — ABNORMAL HIGH (ref 0–200)
HDL: 27.7 mg/dL — ABNORMAL LOW (ref >39.00)
NONHDL: 255.59
Total CHOL/HDL Ratio: 10
Triglycerides: 375 mg/dL — ABNORMAL HIGH (ref 0.0–149.0)
VLDL: 75 mg/dL — ABNORMAL HIGH (ref 0.0–40.0)

## 2017-10-24 NOTE — Patient Instructions (Signed)
Set up follow up with primary care [place] home in [place]

## 2017-10-24 NOTE — Progress Notes (Signed)
  Subjective:     Patient ID: Gabriel Miller, male   DOB: 08/28/1960, 57 y.o.   MRN: [id number]  HPI Patient is here for medical follow-up and with new problem as below of right foot pain. [place] moved to [place] over year ago and has had very poor compliance with follow-up. Did run out of blood pressure medication until few weeks ago and is now back on that. He is not taking cholesterol medication. Still smokes.  New problem of right foot pain. Onset about 6-12 months ago. He denies injury. Location is distal fifth metatarsal right foot. He has some bony prominence in that area. Pain with ambulation  He is followed by psychiatry and on several medications for depression.  He has some "growths" on scrotum.  Has apparently had a couple excised in past.  Non-painful.  Past Medical History:  Diagnosis Date  . Alcohol abuse   . Depression   . GERD (gastroesophageal reflux disease)   . Hypertension    Past Surgical History:  Procedure Laterality Date  . MIDDLE EAR SURGERY  1990    reports that he has been smoking cigarettes.  He has a 20.00 pack-year smoking history. He has never used smokeless tobacco. He reports that he does not drink alcohol or use drugs. family history includes Alcohol abuse in his father; Arthritis in his mother; COPD in his mother; Cancer in his maternal aunt and maternal grandmother; Diabetes in his father, maternal grandfather, maternal grandmother, mother, and paternal grandfather; Heart attack in his father; Heart disease in his mother; Hypertension in his father, maternal aunt, and mother. No Known Allergies   Review of Systems  Constitutional: Negative for fatigue and unexpected weight change.  Eyes: Negative for visual disturbance.  Respiratory: Negative for cough, chest tightness and shortness of breath.   Cardiovascular: Negative for chest pain, palpitations and leg swelling.  Endocrine: Negative for polydipsia and polyuria.  Neurological: Negative for  dizziness, syncope, weakness, light-headedness and headaches.       Objective:   Physical Exam  Constitutional: He appears well-developed and well-nourished.  Cardiovascular: Normal rate and regular rhythm.  1+ dorsalis pedis pulses bilaterally. Good capillary refill in both feet  Pulmonary/Chest: Effort normal and breath sounds normal.  Musculoskeletal: He exhibits no edema.  Patient has bony prominence right foot distal fifth metatarsal near the joint. Slightly tender to palpation. No overlying erythema. No fluctuance. Non-mobile.    Skin:  He has some rounded well-circumscribed whitish calcified type lesions on his scrotal area       Assessment:     #1 right foot pain with bony prominence right fifth metatarsal distally. Question hypertrophic changes related to arthritis  #2 hypertension stable and at goal  #3 history of severe dyslipidemia with triglycerides over 1000  #4 ongoing nicotine use  #5 history of poor compliance with follow-up  #6 calcinosis cutis (idiopathic calcification of scrotum)    Plan:     -Obtain x-rays right foot- no acute bony abnormality seen.  Will be over read per radiology. -Check further labs with CBC, comprehensive metabolic panel, lipid panel -Reassurance regarding benign scrotal calcifications -Strongly advised to stop smoking. Current motivation is low -He will try to establish with new primary care in [place]  Katya Rolston W Gabriel Maack MD [facility]

## 2017-10-28 ENCOUNTER — Other Ambulatory Visit: Payer: Self-pay | Admitting: Family Medicine

## 2017-11-01 ENCOUNTER — Encounter: Payer: Self-pay | Admitting: *Deleted

## 2017-11-15 ENCOUNTER — Other Ambulatory Visit: Payer: Self-pay | Admitting: Family Medicine

## 2017-11-16 ENCOUNTER — Other Ambulatory Visit (HOSPITAL_COMMUNITY): Payer: Self-pay

## 2017-11-16 ENCOUNTER — Telehealth: Payer: Self-pay | Admitting: Family Medicine

## 2017-11-16 MED ORDER — HALOPERIDOL 5 MG PO TABS: 5.0000 mg | ORAL_TABLET | Freq: Every day | ORAL | 0 refills | Status: DC

## 2017-11-16 NOTE — Telephone Encounter (Signed)
I attempted to call him to let him know he needs to call Dr. Archer AsaGerald Plovsky for this refill.   His phone number is out of service.

## 2017-11-16 NOTE — Telephone Encounter (Signed)
Copied from CRM [phone]. Topic: Quick Communication - See Telephone Encounter >> Nov 16, 2017  2:27 PM Waymon AmatoBurton, Donna F wrote: Pt is needing a refill of his haldol (generic) pt states that he only has a few left   Best [phone]  [address]

## 2018-01-03 ENCOUNTER — Other Ambulatory Visit (HOSPITAL_COMMUNITY): Payer: Self-pay

## 2018-01-03 MED ORDER — HALOPERIDOL 5 MG PO TABS: 5.0000 mg | ORAL_TABLET | Freq: Every day | ORAL | 0 refills | Status: DC

## 2018-01-09 ENCOUNTER — Other Ambulatory Visit (HOSPITAL_COMMUNITY): Payer: Self-pay

## 2018-01-09 MED ORDER — SUVOREXANT 20 MG PO TABS: 20.0000 mg | ORAL_TABLET | Freq: Every evening | ORAL | 1 refills | Status: DC

## 2018-01-16 ENCOUNTER — Other Ambulatory Visit (HOSPITAL_COMMUNITY): Payer: Self-pay

## 2018-01-16 MED ORDER — FLUOXETINE HCL 20 MG PO CAPS: ORAL_CAPSULE | ORAL | 1 refills | Status: DC

## 2018-01-17 ENCOUNTER — Ambulatory Visit (INDEPENDENT_AMBULATORY_CARE_PROVIDER_SITE_OTHER): Payer: Medicare HMO | Admitting: Psychiatry

## 2018-01-17 ENCOUNTER — Encounter (HOSPITAL_COMMUNITY): Payer: Self-pay | Admitting: Psychiatry

## 2018-01-17 VITALS — BP 110/73 | HR 72 | Ht 69.0 in | Wt 200.0 lb

## 2018-01-17 DIAGNOSIS — F323 Major depressive disorder, single episode, severe with psychotic features: Secondary | ICD-10-CM

## 2018-01-17 DIAGNOSIS — F339 Major depressive disorder, recurrent, unspecified: Secondary | ICD-10-CM

## 2018-01-17 MED ORDER — GABAPENTIN 400 MG PO CAPS: ORAL_CAPSULE | ORAL | 8 refills | Status: DC

## 2018-01-17 MED ORDER — FLUOXETINE HCL 20 MG PO CAPS: ORAL_CAPSULE | ORAL | 1 refills | Status: DC

## 2018-01-17 MED ORDER — HALOPERIDOL 2 MG PO TABS: 2.0000 mg | ORAL_TABLET | Freq: Every day | ORAL | 6 refills | Status: DC

## 2018-01-17 MED ORDER — SUVOREXANT 20 MG PO TABS: 20.0000 mg | ORAL_TABLET | Freq: Every evening | ORAL | 1 refills | Status: DC

## 2018-01-17 NOTE — Progress Notes (Signed)
Patient ID: Gabriel Miller, male   DOB: 10/16/1960, 57 y.o.   MRN: [id number] [facility] MD Progress Note  01/17/2018 3:22 PM RACHID PARHAM  MRN:  [id number] Subjective:  Principal Problem: Major depression with psychosis Diagnosis:  Major depression with psychosis Today the patient is doing well.  He says his mood is good.  He is sleeping and eating very well.  His sleep is much improved off the trazodone but taking 20 mg of Belsomra.  He says is dramatically more helpful.  His energy level is good.  He can think and concentrate well.  He enjoys playing with his 5 grandchildren.  He watches TV listens to music and emotionally is very stable.  He denies auditory or visual hallucinations.  He denies the use of drugs or alcohol.  He has no symptoms of psychosis.  He is not suicidal.  He shows no evidence of anxiety.  He lives with his brother or half the time and his son the other hand.  The patient is recovered very well from the death of his mother.  She died last Jun 23, 2022.  Patient takes all his medicines just as prescribed.  Today he had a AIM scale which was negative.  This dose of Haldol he is on 2 mg is the lowest effective dose.  He is actually functioning very well.  Past Medical History:  Past Medical History:  Diagnosis Date  . Alcohol abuse   . Depression   . GERD (gastroesophageal reflux disease)   . Hypertension     Past Surgical History:  Procedure Laterality Date  . MIDDLE EAR SURGERY  1990   Family History:  Family History  Problem Relation Age of Onset  . Arthritis Mother   . Heart disease Mother   . Hypertension Mother   . Diabetes Mother   . COPD Mother   . Alcohol abuse Father   . Hypertension Father   . Diabetes Father   . Heart attack Father   . Hypertension Maternal Aunt   . Cancer Maternal Aunt        breast  . Cancer Maternal Grandmother        breast  . Diabetes Maternal Grandmother   . Diabetes Maternal Grandfather   . Diabetes Paternal Grandfather    Social  History:  Social History   Substance and Sexual Activity  Alcohol Use No   Comment: drinks half-gallon liquor once a week.     Social History   Substance and Sexual Activity  Drug Use No    Social History   Socioeconomic History  . Marital status: Single    Spouse name: Not on file  . Number of children: Not on file  . Years of education: Not on file  . Highest education level: Not on file  Occupational History  . Not on file  Social Needs  . Financial resource strain: Not on file  . Food insecurity:    Worry: Not on file    Inability: Not on file  . Transportation needs:    Medical: Not on file    Non-medical: Not on file  Tobacco Use  . Smoking status: Current Every Day Smoker    Packs/day: 1.00    Years: 20.00    Pack years: 20.00    Types: Cigarettes  . Smokeless tobacco: Never Used  . Tobacco comment: Has cut back to 1/2 pack and working on quiting   Substance and Sexual Activity  . Alcohol use: No  Comment: drinks half-gallon liquor once a week.  . Drug use: No  . Sexual activity: Not Currently  Lifestyle  . Physical activity:    Days per week: Not on file    Minutes per session: Not on file  . Stress: Not on file  Relationships  . Social connections:    Talks on phone: Not on file    Gets together: Not on file    Attends religious service: Not on file    Active member of club or organization: Not on file    Attends meetings of clubs or organizations: Not on file    Relationship status: Not on file  Other Topics Concern  . Not on file  Social History Narrative  . Not on file   Additional History:    Sleep: Good  Appetite:  Good   Assessment:   Musculoskeletal: Strength & Muscle Tone: within normal limits Gait & Station: normal Patient leans: Right   Psychiatric Specialty Exam: Physical Exam  ROS  Blood pressure 110/73, pulse 72, height 5\' 9"  (1.753 m), weight 200 lb (90.7 kg), SpO2 100 %.Body mass index is 29.53 kg/m.  General  Appearance: NA  Eye Contact::  Good  Speech:  Normal Rate  Volume:  Normal  Mood:  NA  Affect:  Appropriate  Thought Process:  Goal Directed  Orientation:  Full (Time, Place, and Person)  Thought Content:  WDL  Suicidal Thoughts:  No  Homicidal Thoughts:  No  Memory:  NA  Judgement:  Good  Insight:  Good  Psychomotor Activity:  Normal  Concentration:  Good  Recall:  Good  Fund of Knowledge:Good  Language: Poor  Akathisia:  No  Handed:  Right  AIMS (if indicated):     Assets:  Desire for Improvement  ADL's:  Intact  Cognition: WNL  Sleep:        Current Medications: Current Outpatient Medications  Medication Sig Dispense Refill  . FLUoxetine (PROZAC) 20 MG capsule 2 qam 180 capsule 1  . gabapentin (NEURONTIN) 400 MG capsule 1 Po qam and 3 po qhs 120 capsule 8  . haloperidol (HALDOL) 2 MG tablet Take 1 tablet (2 mg total) by mouth at bedtime. 30 tablet 6  . losartan-hydrochlorothiazide (HYZAAR) 100-12.5 MG tablet TAKE 1 TABLET BY MOUTH EVERY DAY 90 tablet 3  . losartan-hydrochlorothiazide (HYZAAR) 100-12.5 MG tablet TAKE 1 TABLET BY MOUTH DAILY 90 tablet 0  . Suvorexant (BELSOMRA) 20 MG TABS Take 20 mg by mouth Nightly. 30 tablet 1  . traZODone (DESYREL) 100 MG tablet 3 qhs  May add a third pill 90 tablet 10  . albuterol (PROVENTIL) (5 MG/ML) 0.5% nebulizer solution Take 0.5 mLs (2.5 mg total) by nebulization every 2 (two) hours as needed for wheezing or shortness of breath. 20 mL 0  . HYDROcodone-acetaminophen (NORCO/VICODIN) 5-325 MG tablet Take 1 tablet by mouth every 6 (six) hours as needed for moderate pain. (Patient not taking: Reported on 01/17/2018) 20 tablet 0  . rosuvastatin (CRESTOR) 20 MG tablet Take 1 tablet (20 mg total) by mouth daily. (Patient not taking: Reported on 10/24/2017) 90 tablet 1  . sildenafil (VIAGRA) 100 MG tablet Take 0.5-1 tablets (50-100 mg total) by mouth daily as needed for erectile dysfunction. (Patient not taking: Reported on 01/17/2018) 5  tablet 11  . tiotropium (SPIRIVA) 18 MCG inhalation capsule Place 1 capsule (18 mcg total) into inhaler and inhale daily. 30 capsule 5   No current facility-administered medications for this visit.  Lab Results: No results found for this or any previous visit (from the past 48 hour(s)).  Physical Findings: AIMS:  , ,  ,  ,    CIWA:    COWS:     Treatment Plan Summary: Today the patient is doing very well.  His first major problem is that of depression with psychosis.  He takes 40 mg of Prozac and 2 mg of Haldol and does very well.  His second problem is associated with his anxiety.  He takes Neurontin 400 mg 1 in the morning and 3 at night.  This is very effective for him.  The patient is physically very healthy.  He denies any chest pain or shortness of breath.  He denies any neurological symptoms at this time.  He is very stable.  He will return to see me in 5 months for just a 15-minute visit.  He is not suicidal nor she homicidal. Gypsy Balsam 01/17/2018, 3:22 PM

## 2018-02-06 DIAGNOSIS — E663 Overweight: Secondary | ICD-10-CM | POA: Diagnosis not present

## 2018-02-06 DIAGNOSIS — Z6829 Body mass index (BMI) 29.0-29.9, adult: Secondary | ICD-10-CM | POA: Diagnosis not present

## 2018-02-06 DIAGNOSIS — J449 Chronic obstructive pulmonary disease, unspecified: Secondary | ICD-10-CM | POA: Diagnosis not present

## 2018-02-06 DIAGNOSIS — Z1159 Encounter for screening for other viral diseases: Secondary | ICD-10-CM | POA: Diagnosis not present

## 2018-02-06 DIAGNOSIS — I10 Essential (primary) hypertension: Secondary | ICD-10-CM | POA: Diagnosis not present

## 2018-02-06 DIAGNOSIS — Z23 Encounter for immunization: Secondary | ICD-10-CM | POA: Diagnosis not present

## 2018-02-06 DIAGNOSIS — F39 Unspecified mood [affective] disorder: Secondary | ICD-10-CM | POA: Diagnosis not present

## 2018-02-06 DIAGNOSIS — E785 Hyperlipidemia, unspecified: Secondary | ICD-10-CM | POA: Diagnosis not present

## 2018-02-06 DIAGNOSIS — Z79899 Other long term (current) drug therapy: Secondary | ICD-10-CM | POA: Diagnosis not present

## 2018-02-06 DIAGNOSIS — N182 Chronic kidney disease, stage 2 (mild): Secondary | ICD-10-CM | POA: Diagnosis not present

## 2018-02-13 ENCOUNTER — Other Ambulatory Visit: Payer: Self-pay | Admitting: Family Medicine

## 2018-03-09 ENCOUNTER — Other Ambulatory Visit: Payer: Self-pay | Admitting: Family Medicine

## 2018-03-09 NOTE — Telephone Encounter (Signed)
Last OV 10/24/2017, No future refill  Last OV note said this patient was going to set up with a new PCP near [place].  Please advise if okay to fill?

## 2018-03-09 NOTE — Telephone Encounter (Signed)
May refill once.  We need to clarify whether he will stay here or establish with new primary

## 2018-04-17 ENCOUNTER — Other Ambulatory Visit (HOSPITAL_COMMUNITY): Payer: Self-pay

## 2018-04-17 MED ORDER — SUVOREXANT 20 MG PO TABS: 20.0000 mg | ORAL_TABLET | Freq: Every evening | ORAL | 1 refills | Status: DC

## 2018-05-25 ENCOUNTER — Ambulatory Visit (HOSPITAL_COMMUNITY): Payer: Medicare HMO | Admitting: Psychiatry

## 2018-06-18 ENCOUNTER — Ambulatory Visit (INDEPENDENT_AMBULATORY_CARE_PROVIDER_SITE_OTHER): Payer: Medicare HMO | Admitting: Family Medicine

## 2018-06-18 ENCOUNTER — Other Ambulatory Visit (HOSPITAL_COMMUNITY): Payer: Self-pay

## 2018-06-18 ENCOUNTER — Other Ambulatory Visit: Payer: Self-pay

## 2018-06-18 ENCOUNTER — Other Ambulatory Visit: Payer: Self-pay | Admitting: Family Medicine

## 2018-06-18 ENCOUNTER — Other Ambulatory Visit (HOSPITAL_COMMUNITY): Payer: Self-pay | Admitting: Psychiatry

## 2018-06-18 DIAGNOSIS — I10 Essential (primary) hypertension: Secondary | ICD-10-CM

## 2018-06-18 MED ORDER — LOSARTAN POTASSIUM-HCTZ 100-12.5 MG PO TABS: 1.0000 | ORAL_TABLET | Freq: Every day | ORAL | 0 refills | Status: AC

## 2018-06-18 MED ORDER — SUVOREXANT 20 MG PO TABS: 20.0000 mg | ORAL_TABLET | Freq: Every evening | ORAL | 0 refills | Status: DC

## 2018-06-18 NOTE — Progress Notes (Signed)
Patient ID: Gabriel Miller, male   DOB: Jun 01, 1960, 58 y.o.   MRN: [id number]  Virtual Visit via Telephone Note  I connected with Gabriel Miller on 06/18/18 at  2:30 PM EDT by telephone and verified that I am speaking with the correct person using two identifiers.   I discussed the limitations, risks, security and privacy concerns of performing an evaluation and management service by telephone and the availability of in person appointments. I also discussed with the patient that there may be a patient responsible charge related to this service. The patient expressed understanding and agreed to proceed.  Location patient: home Location provider: work or home office Participants present for the call: patient, provider Patient did not have a visit in the prior 7 days to address this/these issue(s).   History of Present Illness: Patient has multiple chronic problems including history of COPD, ongoing nicotine use, hypertension, chronic kidney disease, dyslipidemia, obesity, bipolar disorder.  He called in earlier today requesting refills of blood pressure medication.  It appears that he has established with another primary care clinic in [place] but for some reason was calling here for refills of his blood pressure medication.  He seemed a little bit confused regarding the other practice.  He states that his daughter helps set up most of his follow-ups.  He takes losartan HCTZ 1 daily.  He states his been compliant with medications.  Denies any recent headaches, dizziness, chest pains   Observations/Objective: Patient sounds cheerful and well on the phone. I do not appreciate any SOB. Speech and thought processing are grossly intact. Patient reported vitals:  Assessment and Plan: Hypertension.  Longstanding history of poor compliance with follow-up  -We agreed to 1 month refill of his losartan HCTZ -We explained that he should not be getting primary care through 2 separate offices and he will  be checking with his daughter regarding setting up follow-up in [place] where he currently lives.  Follow Up Instructions:  -As above   [id number] [id number] [id number] 11-20 [id number] 21-30 I did not refer this patient for an OV in the next 24 hours for this/these issue(s).  I discussed the assessment and treatment plan with the patient. The patient was provided an opportunity to ask questions and all were answered. The patient agreed with the plan and demonstrated an understanding of the instructions.   The patient was advised to call back or seek an in-person evaluation if the symptoms worsen or if the condition fails to improve as anticipated.  I provided 8 minutes of non-face-to-face time during this encounter.   Evelena Peat, MD

## 2018-06-18 NOTE — Telephone Encounter (Signed)
Patient has a telephone appointment today at 2:30pm so he can get his Rx refills. Patient stated that he needs to find a new doctor in [place] and has not found one yet. He verbalized an understanding for the telephone visit today and is aware to let us know when he finds a new doctor.

## 2018-06-19 NOTE — Telephone Encounter (Signed)
Refilled for one month.  He has established care with primary care in [place] and should contact them for further refills.

## 2018-06-22 ENCOUNTER — Other Ambulatory Visit: Payer: Self-pay

## 2018-06-22 ENCOUNTER — Ambulatory Visit (INDEPENDENT_AMBULATORY_CARE_PROVIDER_SITE_OTHER): Payer: Medicare HMO | Admitting: Psychiatry

## 2018-06-22 DIAGNOSIS — F3342 Major depressive disorder, recurrent, in full remission: Secondary | ICD-10-CM | POA: Diagnosis not present

## 2018-06-22 DIAGNOSIS — F323 Major depressive disorder, single episode, severe with psychotic features: Secondary | ICD-10-CM

## 2018-06-22 MED ORDER — HALOPERIDOL 2 MG PO TABS: 2.0000 mg | ORAL_TABLET | Freq: Every day | ORAL | 8 refills | Status: DC

## 2018-06-22 MED ORDER — FLUOXETINE HCL 20 MG PO CAPS: ORAL_CAPSULE | ORAL | 1 refills | Status: DC

## 2018-06-22 MED ORDER — GABAPENTIN 400 MG PO CAPS: ORAL_CAPSULE | ORAL | 8 refills | Status: DC

## 2018-06-22 MED ORDER — SUVOREXANT 20 MG PO TABS: 20.0000 mg | ORAL_TABLET | Freq: Every evening | ORAL | 4 refills | Status: DC

## 2018-06-22 NOTE — Progress Notes (Signed)
Patient ID: Gabriel Miller, male   DOB: 11-11-1960, 58 y.o.   MRN: [id number] [facility] MD Progress Note  06/22/2018 11:23 AM Gabriel Miller  MRN:  [id number] Subjective:  Principal Problem: Major depression with psychosis Diagnosis: Major depression with psychosis  At this time the patient is doing very well.  He is at home with his children including his 2 grandchildren.  He likes it a great deal.  He feels very comfortable.  Physically he is well.  He denies a cough or fever.  He denies any chest pain or shortness of breath.  He is enjoying life.  He is sleeping and eating well.  He has no evidence of psychosis.  He denies use of drugs or alcohol.  Is positive and friendly engaging.  He is sleeping well taking Belsomra.   Past Medical History:  Past Medical History:  Diagnosis Date  . Alcohol abuse   . Depression   . GERD (gastroesophageal reflux disease)   . Hypertension     Past Surgical History:  Procedure Laterality Date  . MIDDLE EAR SURGERY  1990   Family History:  Family History  Problem Relation Age of Onset  . Arthritis Mother   . Heart disease Mother   . Hypertension Mother   . Diabetes Mother   . COPD Mother   . Alcohol abuse Father   . Hypertension Father   . Diabetes Father   . Heart attack Father   . Hypertension Maternal Aunt   . Cancer Maternal Aunt        breast  . Cancer Maternal Grandmother        breast  . Diabetes Maternal Grandmother   . Diabetes Maternal Grandfather   . Diabetes Paternal Grandfather    Social History:  Social History   Substance and Sexual Activity  Alcohol Use No   Comment: drinks half-gallon liquor once a week.     Social History   Substance and Sexual Activity  Drug Use No    Social History   Socioeconomic History  . Marital status: Single    Spouse name: Not on file  . Number of children: Not on file  . Years of education: Not on file  . Highest education level: Not on file  Occupational History  . Not on file   Social Needs  . Financial resource strain: Not on file  . Food insecurity:    Worry: Not on file    Inability: Not on file  . Transportation needs:    Medical: Not on file    Non-medical: Not on file  Tobacco Use  . Smoking status: Current Every Day Smoker    Packs/day: 1.00    Years: 20.00    Pack years: 20.00    Types: Cigarettes  . Smokeless tobacco: Never Used  . Tobacco comment: Has cut back to 1/2 pack and working on quiting   Substance and Sexual Activity  . Alcohol use: No    Comment: drinks half-gallon liquor once a week.  . Drug use: No  . Sexual activity: Not Currently  Lifestyle  . Physical activity:    Days per week: Not on file    Minutes per session: Not on file  . Stress: Not on file  Relationships  . Social connections:    Talks on phone: Not on file    Gets together: Not on file    Attends religious service: Not on file    Active member of club or organization: Not  on file    Attends meetings of clubs or organizations: Not on file    Relationship status: Not on file  Other Topics Concern  . Not on file  Social History Narrative  . Not on file   Additional History:    Sleep: Good  Appetite:  Good   Assessment:   Musculoskeletal: Strength & Muscle Tone: within normal limits Gait & Station: normal Patient leans: Right   Psychiatric Specialty Exam: Physical Exam  ROS  There were no vitals taken for this visit.There is no height or weight on file to calculate BMI.  General Appearance: NA  Eye Contact::  Good  Speech:  Normal Rate  Volume:  Normal  Mood:  NA  Affect:  Appropriate  Thought Process:  Goal Directed  Orientation:  Full (Time, Place, and Person)  Thought Content:  WDL  Suicidal Thoughts:  No  Homicidal Thoughts:  No  Memory:  NA  Judgement:  Good  Insight:  Good  Psychomotor Activity:  Normal  Concentration:  Good  Recall:  Good  Fund of Knowledge:Good  Language: Poor  Akathisia:  No  Handed:  Right  AIMS (if  indicated):     Assets:  Desire for Improvement  ADL's:  Intact  Cognition: WNL  Sleep:        Current Medications: Current Outpatient Medications  Medication Sig Dispense Refill  . albuterol (PROVENTIL) (5 MG/ML) 0.5% nebulizer solution Take 0.5 mLs (2.5 mg total) by nebulization every 2 (two) hours as needed for wheezing or shortness of breath. 20 mL 0  . FLUoxetine (PROZAC) 20 MG capsule 2 qam 180 capsule 1  . gabapentin (NEURONTIN) 400 MG capsule 1 Po qam and 3 po qhs 120 capsule 8  . haloperidol (HALDOL) 2 MG tablet Take 1 tablet (2 mg total) by mouth at bedtime. 30 tablet 8  . HYDROcodone-acetaminophen (NORCO/VICODIN) 5-325 MG tablet Take 1 tablet by mouth every 6 (six) hours as needed for moderate pain. (Patient not taking: Reported on 01/17/2018) 20 tablet 0  . losartan-hydrochlorothiazide (HYZAAR) 100-12.5 MG tablet TAKE 1 TABLET BY MOUTH DAILY 30 tablet 0  . losartan-hydrochlorothiazide (HYZAAR) 100-12.5 MG tablet Take 1 tablet by mouth daily. 30 tablet 0  . rosuvastatin (CRESTOR) 20 MG tablet Take 1 tablet (20 mg total) by mouth daily. (Patient not taking: Reported on 10/24/2017) 90 tablet 1  . sildenafil (VIAGRA) 100 MG tablet Take 0.5-1 tablets (50-100 mg total) by mouth daily as needed for erectile dysfunction. (Patient not taking: Reported on 01/17/2018) 5 tablet 11  . Suvorexant (BELSOMRA) 20 MG TABS Take 20 mg by mouth Nightly. 30 tablet 4  . tiotropium (SPIRIVA) 18 MCG inhalation capsule Place 1 capsule (18 mcg total) into inhaler and inhale daily. 30 capsule 5  . traZODone (DESYREL) 100 MG tablet 3 qhs  May add a third pill 90 tablet 10   No current facility-administered medications for this visit.     Lab Results: No results found for this or any previous visit (from the past 48 hour(s)).  Physical Findings: AIMS:  , ,  ,  ,    CIWA:    COWS:     Treatment Plan Summary: This patient has 2 problems.  His first problem is major depression with psychosis which really  has resolved.  He takes Prozac 40 mg and 2 mg of Haldol.  Today was a phone call discussion.  Could not assess his absence or presence of tardive dyskinesia.  In my view on his next  visit which will be a person we should consider getting rid of his Haldol.  The second problem is that of insomnia.  He will continue taking 20 mg of Belsomra.  His third problem is really anxiety.  He takes 400 mg of Neurontin 1 in the morning and 3 at night.  This is very helpful for sleep and for his anxiety.  Patient presently is not in therapy.  His symptoms are minimal to absent.  He will be given another appointment to see me in 6 months. Osker Mason Beatris Belen 06/22/2018, 11:23 AM

## 2018-06-28 ENCOUNTER — Other Ambulatory Visit: Payer: Self-pay

## 2018-06-28 ENCOUNTER — Telehealth: Payer: Self-pay | Admitting: Family Medicine

## 2018-06-28 ENCOUNTER — Other Ambulatory Visit: Payer: Self-pay | Admitting: Family Medicine

## 2018-06-28 MED ORDER — HYDROCHLOROTHIAZIDE 12.5 MG PO TABS: 12.5000 mg | ORAL_TABLET | Freq: Every day | ORAL | 0 refills | Status: DC

## 2018-06-28 MED ORDER — LOSARTAN POTASSIUM 100 MG PO TABS: 100.0000 mg | ORAL_TABLET | Freq: Every day | ORAL | 0 refills | Status: DC

## 2018-06-28 NOTE — Telephone Encounter (Signed)
OK 

## 2018-06-28 NOTE — Telephone Encounter (Signed)
Copied from CRM [id number]. Topic: Quick Communication - Rx Refill/Question >> Jun 28, 2018 10:08 AM Lynne Logan D wrote: Medication: losartan-hydrochlorothiazide (HYZAAR) 100-12.5 MG tablet / Pt's son stated the pharmacy does not have medication in stock and would like to know if new rx can be sent in separating Losartan and HTCZ. Please advise.  Has the patient contacted their pharmacy? Yes.   (Agent: If no, request that the patient contact the pharmacy for the refill.) (Agent: If yes, when and what did the pharmacy advise?)  Preferred Pharmacy (with phone number or street name): [phone] - [place], [place] - [address] AT [facility] [address] [facility] [address] [phone] (Phone) [phone] (Fax)    Agent: Please be advised that RX refills may take up to 3 business days. We ask that you follow-up with your pharmacy.

## 2018-06-28 NOTE — Telephone Encounter (Signed)
Separated medications have been sent to the pharmacy requested.

## 2018-06-28 NOTE — Telephone Encounter (Signed)
OK to separate for patient and send to pharmacy? Please see message.

## 2018-08-04 ENCOUNTER — Other Ambulatory Visit: Payer: Self-pay | Admitting: Family Medicine

## 2018-08-06 ENCOUNTER — Other Ambulatory Visit: Payer: Self-pay | Admitting: Family Medicine

## 2018-08-10 DIAGNOSIS — J449 Chronic obstructive pulmonary disease, unspecified: Secondary | ICD-10-CM | POA: Diagnosis not present

## 2018-08-10 DIAGNOSIS — Z6829 Body mass index (BMI) 29.0-29.9, adult: Secondary | ICD-10-CM | POA: Diagnosis not present

## 2018-08-10 DIAGNOSIS — F17210 Nicotine dependence, cigarettes, uncomplicated: Secondary | ICD-10-CM | POA: Diagnosis not present

## 2018-08-10 DIAGNOSIS — N182 Chronic kidney disease, stage 2 (mild): Secondary | ICD-10-CM | POA: Diagnosis not present

## 2018-08-10 DIAGNOSIS — E663 Overweight: Secondary | ICD-10-CM | POA: Diagnosis not present

## 2018-08-10 DIAGNOSIS — F3130 Bipolar disorder, current episode depressed, mild or moderate severity, unspecified: Secondary | ICD-10-CM | POA: Diagnosis not present

## 2018-08-10 DIAGNOSIS — F39 Unspecified mood [affective] disorder: Secondary | ICD-10-CM | POA: Diagnosis not present

## 2018-08-10 DIAGNOSIS — I10 Essential (primary) hypertension: Secondary | ICD-10-CM | POA: Diagnosis not present

## 2018-08-10 DIAGNOSIS — E785 Hyperlipidemia, unspecified: Secondary | ICD-10-CM | POA: Diagnosis not present

## 2018-09-05 ENCOUNTER — Other Ambulatory Visit: Payer: Self-pay | Admitting: Family Medicine

## 2018-09-07 DIAGNOSIS — Z135 Encounter for screening for eye and ear disorders: Secondary | ICD-10-CM | POA: Diagnosis not present

## 2018-09-07 DIAGNOSIS — I10 Essential (primary) hypertension: Secondary | ICD-10-CM | POA: Diagnosis not present

## 2018-09-07 DIAGNOSIS — Z87891 Personal history of nicotine dependence: Secondary | ICD-10-CM | POA: Diagnosis not present

## 2018-09-07 DIAGNOSIS — Z125 Encounter for screening for malignant neoplasm of prostate: Secondary | ICD-10-CM | POA: Diagnosis not present

## 2018-09-07 DIAGNOSIS — E663 Overweight: Secondary | ICD-10-CM | POA: Diagnosis not present

## 2018-09-07 DIAGNOSIS — Z6829 Body mass index (BMI) 29.0-29.9, adult: Secondary | ICD-10-CM | POA: Diagnosis not present

## 2018-09-07 DIAGNOSIS — Z0000 Encounter for general adult medical examination without abnormal findings: Secondary | ICD-10-CM | POA: Diagnosis not present

## 2018-09-07 DIAGNOSIS — Z136 Encounter for screening for cardiovascular disorders: Secondary | ICD-10-CM | POA: Diagnosis not present

## 2018-09-07 DIAGNOSIS — J449 Chronic obstructive pulmonary disease, unspecified: Secondary | ICD-10-CM | POA: Diagnosis not present

## 2018-09-11 ENCOUNTER — Other Ambulatory Visit: Payer: Self-pay | Admitting: Family Medicine

## 2018-10-18 DIAGNOSIS — J449 Chronic obstructive pulmonary disease, unspecified: Secondary | ICD-10-CM | POA: Diagnosis not present

## 2018-12-21 ENCOUNTER — Other Ambulatory Visit: Payer: Self-pay

## 2018-12-21 ENCOUNTER — Ambulatory Visit (INDEPENDENT_AMBULATORY_CARE_PROVIDER_SITE_OTHER): Payer: Medicare HMO | Admitting: Psychiatry

## 2018-12-21 DIAGNOSIS — G4700 Insomnia, unspecified: Secondary | ICD-10-CM | POA: Diagnosis not present

## 2018-12-21 DIAGNOSIS — F325 Major depressive disorder, single episode, in full remission: Secondary | ICD-10-CM

## 2018-12-21 DIAGNOSIS — F323 Major depressive disorder, single episode, severe with psychotic features: Secondary | ICD-10-CM

## 2018-12-21 MED ORDER — HALOPERIDOL 2 MG PO TABS: 2.0000 mg | ORAL_TABLET | Freq: Every day | ORAL | 8 refills | Status: DC

## 2018-12-21 MED ORDER — BELSOMRA 20 MG PO TABS: 20.0000 mg | ORAL_TABLET | Freq: Every evening | ORAL | 4 refills | Status: DC

## 2018-12-21 MED ORDER — FLUOXETINE HCL 20 MG PO CAPS: ORAL_CAPSULE | ORAL | 1 refills | Status: DC

## 2018-12-21 MED ORDER — GABAPENTIN 400 MG PO CAPS: ORAL_CAPSULE | ORAL | 8 refills | Status: DC

## 2018-12-21 NOTE — Progress Notes (Signed)
Patient ID: Gabriel Miller, male   DOB: 1960/12/04, 58 y.o.   MRN: [id number] [facility] MD Progress Note  12/21/2018 10:55 AM Gabriel Miller  MRN:  [id number] Subjective:  Principal Problem: Major depression with psychosis Diagnosis: Major depression with psychosis  Today the patient is doing well.  He spends a lot of time with his 5 grandchildren taking care of them during the pandemic.  He is handling the pandemic fairly well.  His health is good.  He denies chest pain or shortness of breath or any other neurological symptoms.  Financially he is stable.  He is sleeping and eating well has good energy.  He was working on his garden but not so much now.  He watches TV.  Financially he is stable.  He has no complaints for today.  He stays active.  His thoughts are clear organized and connected.  He was hospitalized 6 years ago for psychiatric admission.  He has been on Haldol well over a decade.  Once again discussion of getting rid of the Haldol will be considered when he returns to see me in about 5 months.  He takes Belsomra for sleep which works very well for him.   Past Medical History:  Past Medical History:  Diagnosis Date  . Alcohol abuse   . Depression   . GERD (gastroesophageal reflux disease)   . Hypertension     Past Surgical History:  Procedure Laterality Date  . MIDDLE EAR SURGERY  1990   Family History:  Family History  Problem Relation Age of Onset  . Arthritis Mother   . Heart disease Mother   . Hypertension Mother   . Diabetes Mother   . COPD Mother   . Alcohol abuse Father   . Hypertension Father   . Diabetes Father   . Heart attack Father   . Hypertension Maternal Aunt   . Cancer Maternal Aunt        breast  . Cancer Maternal Grandmother        breast  . Diabetes Maternal Grandmother   . Diabetes Maternal Grandfather   . Diabetes Paternal Grandfather    Social History:  Social History   Substance and Sexual Activity  Alcohol Use No   Comment: drinks  half-gallon liquor once a week.     Social History   Substance and Sexual Activity  Drug Use No    Social History   Socioeconomic History  . Marital status: Single    Spouse name: Not on file  . Number of children: Not on file  . Years of education: Not on file  . Highest education level: Not on file  Occupational History  . Not on file  Social Needs  . Financial resource strain: Not on file  . Food insecurity    Worry: Not on file    Inability: Not on file  . Transportation needs    Medical: Not on file    Non-medical: Not on file  Tobacco Use  . Smoking status: Current Every Day Smoker    Packs/day: 1.00    Years: 20.00    Pack years: 20.00    Types: Cigarettes  . Smokeless tobacco: Never Used  . Tobacco comment: Has cut back to 1/2 pack and working on quiting   Substance and Sexual Activity  . Alcohol use: No    Comment: drinks half-gallon liquor once a week.  . Drug use: No  . Sexual activity: Not Currently  Lifestyle  . Physical activity  Days per week: Not on file    Minutes per session: Not on file  . Stress: Not on file  Relationships  . Social Musician on phone: Not on file    Gets together: Not on file    Attends religious service: Not on file    Active member of club or organization: Not on file    Attends meetings of clubs or organizations: Not on file    Relationship status: Not on file  Other Topics Concern  . Not on file  Social History Narrative  . Not on file   Additional History:    Sleep: Good  Appetite:  Good   Assessment:   Musculoskeletal: Strength & Muscle Tone: within normal limits Gait & Station: normal Patient leans: Right   Psychiatric Specialty Exam: Physical Exam  ROS  There were no vitals taken for this visit.There is no height or weight on file to calculate BMI.  General Appearance: NA  Eye Contact::  Good  Speech:  Normal Rate  Volume:  Normal  Mood:  NA  Affect:  Appropriate  Thought  Process:  Goal Directed  Orientation:  Full (Time, Place, and Person)  Thought Content:  WDL  Suicidal Thoughts:  No  Homicidal Thoughts:  No  Memory:  NA  Judgement:  Good  Insight:  Good  Psychomotor Activity:  Normal  Concentration:  Good  Recall:  Good  Fund of Knowledge:Good  Language: Poor  Akathisia:  No  Handed:  Right  AIMS (if indicated):     Assets:  Desire for Improvement  ADL's:  Intact  Cognition: WNL  Sleep:        Current Medications: Current Outpatient Medications  Medication Sig Dispense Refill  . albuterol (PROVENTIL) (5 MG/ML) 0.5% nebulizer solution Take 0.5 mLs (2.5 mg total) by nebulization every 2 (two) hours as needed for wheezing or shortness of breath. 20 mL 0  . FLUoxetine (PROZAC) 20 MG capsule 2 qam 180 capsule 1  . gabapentin (NEURONTIN) 400 MG capsule 1 Po qam and 3 po qhs 120 capsule 8  . haloperidol (HALDOL) 2 MG tablet Take 1 tablet (2 mg total) by mouth at bedtime. 30 tablet 8  . hydrochlorothiazide (HYDRODIURIL) 12.5 MG tablet TAKE 1 TABLET(12.5 MG) BY MOUTH DAILY 30 tablet 0  . HYDROcodone-acetaminophen (NORCO/VICODIN) 5-325 MG tablet Take 1 tablet by mouth every 6 (six) hours as needed for moderate pain. (Patient not taking: Reported on 01/17/2018) 20 tablet 0  . losartan (COZAAR) 100 MG tablet TAKE 1 TABLET(100 MG) BY MOUTH DAILY 30 tablet 0  . losartan-hydrochlorothiazide (HYZAAR) 100-12.5 MG tablet TAKE 1 TABLET BY MOUTH DAILY 30 tablet 0  . losartan-hydrochlorothiazide (HYZAAR) 100-12.5 MG tablet Take 1 tablet by mouth daily. 30 tablet 0  . rosuvastatin (CRESTOR) 20 MG tablet Take 1 tablet (20 mg total) by mouth daily. (Patient not taking: Reported on 10/24/2017) 90 tablet 1  . sildenafil (VIAGRA) 100 MG tablet Take 0.5-1 tablets (50-100 mg total) by mouth daily as needed for erectile dysfunction. (Patient not taking: Reported on 01/17/2018) 5 tablet 11  . Suvorexant (BELSOMRA) 20 MG TABS Take 20 mg by mouth Nightly. 30 tablet 4  .  tiotropium (SPIRIVA) 18 MCG inhalation capsule Place 1 capsule (18 mcg total) into inhaler and inhale daily. 30 capsule 5   No current facility-administered medications for this visit.     Lab Results: No results found for this or any previous visit (from the past 48 hour(s)).  Physical Findings: AIMS:  , ,  ,  ,    CIWA:    COWS:     Treatment Plan Summary:ymptoms are minimal to absent.  He will be given another appointment to see me in 6 months. Osker MasonGerald I Timur Nibert  This patient has 2 problems.  The first is major depression.  He takes 40 mg of Prozac and does very well.  He also apparently had psychosis along with his depression and is been on Haldol 2 mg for over a decade.  Unfortunately the patient has not been seen to assess his tardive dyskinesia.  He has no evidence of tardive dyskinesia on previous exams but we will need to see him again in 6 months and reassess this.  His second problem is insomnia.  He takes Belsomra and does very well.  Patient is very stable.  He is functioning very well.  He is got good energy no fatigue no psychosis.  He denies any use of alcohol or drugs.

## 2018-12-28 ENCOUNTER — Ambulatory Visit (HOSPITAL_COMMUNITY): Payer: Medicare HMO | Admitting: Psychiatry

## 2019-01-07 DIAGNOSIS — J449 Chronic obstructive pulmonary disease, unspecified: Secondary | ICD-10-CM | POA: Diagnosis not present

## 2019-01-07 DIAGNOSIS — I10 Essential (primary) hypertension: Secondary | ICD-10-CM | POA: Diagnosis not present

## 2019-01-07 DIAGNOSIS — Z6829 Body mass index (BMI) 29.0-29.9, adult: Secondary | ICD-10-CM | POA: Diagnosis not present

## 2019-01-07 DIAGNOSIS — F3130 Bipolar disorder, current episode depressed, mild or moderate severity, unspecified: Secondary | ICD-10-CM | POA: Diagnosis not present

## 2019-01-07 DIAGNOSIS — N182 Chronic kidney disease, stage 2 (mild): Secondary | ICD-10-CM | POA: Diagnosis not present

## 2019-01-07 DIAGNOSIS — Z79899 Other long term (current) drug therapy: Secondary | ICD-10-CM | POA: Diagnosis not present

## 2019-01-07 DIAGNOSIS — Z23 Encounter for immunization: Secondary | ICD-10-CM | POA: Diagnosis not present

## 2019-01-07 DIAGNOSIS — E785 Hyperlipidemia, unspecified: Secondary | ICD-10-CM | POA: Diagnosis not present

## 2019-01-07 DIAGNOSIS — E663 Overweight: Secondary | ICD-10-CM | POA: Diagnosis not present

## 2019-05-04 IMAGING — DX DG FOOT COMPLETE 3+V*R*
3 series · 3 of 3 positions shown · non-contrast
Comparison: No recent.

CLINICAL DATA: Right foot pain.  No known injury.

EXAM:
RIGHT FOOT COMPLETE - 3+ VIEW

[foot ap]
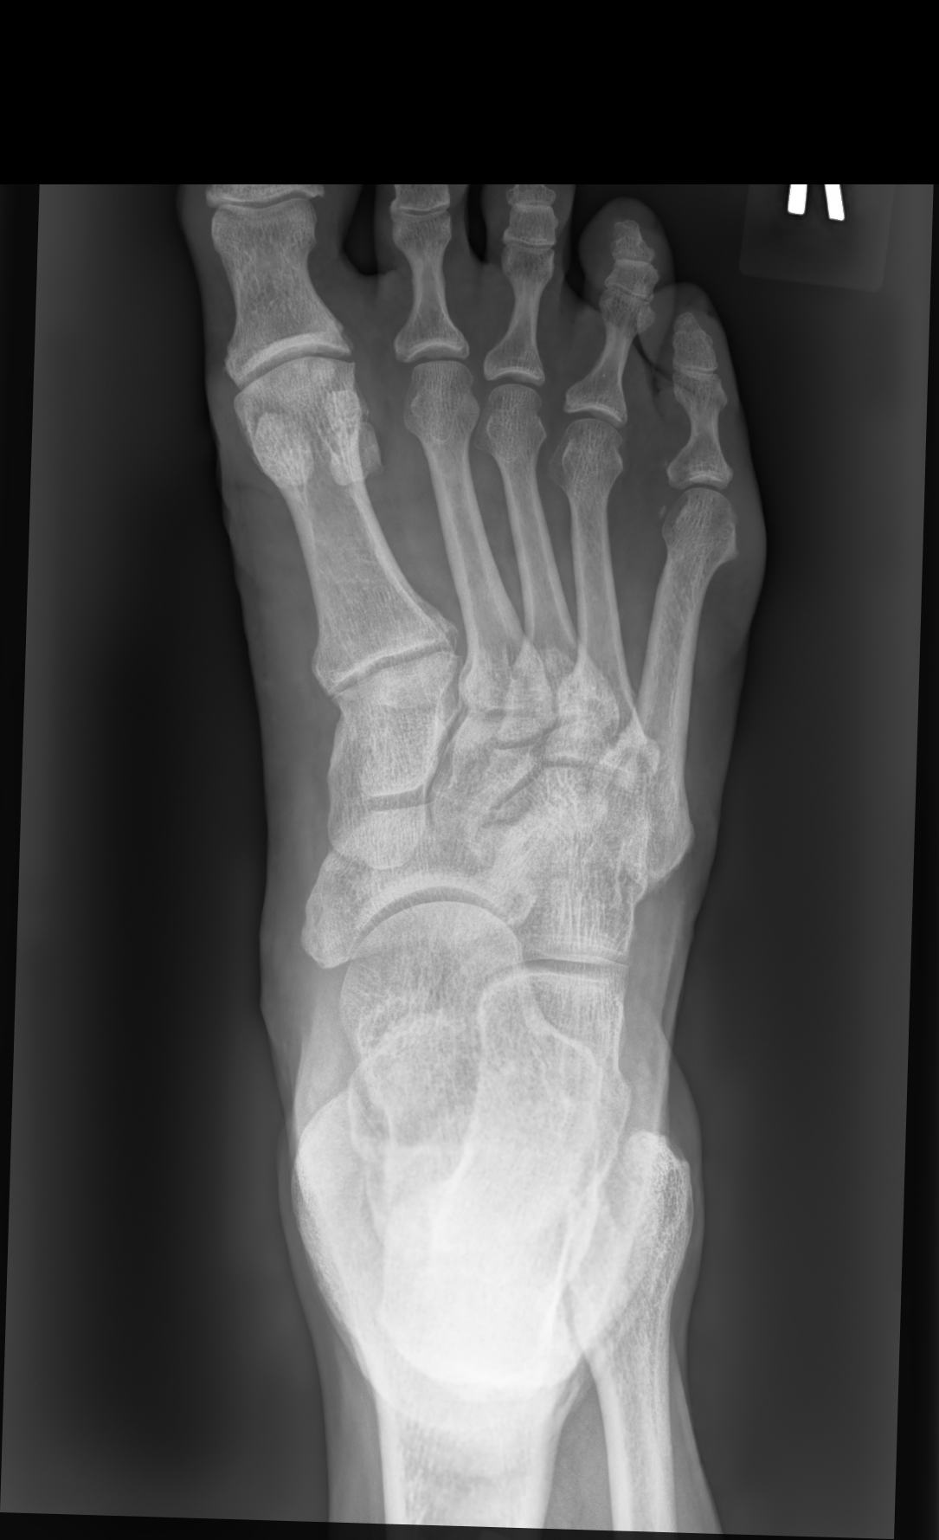

[foot mlo]
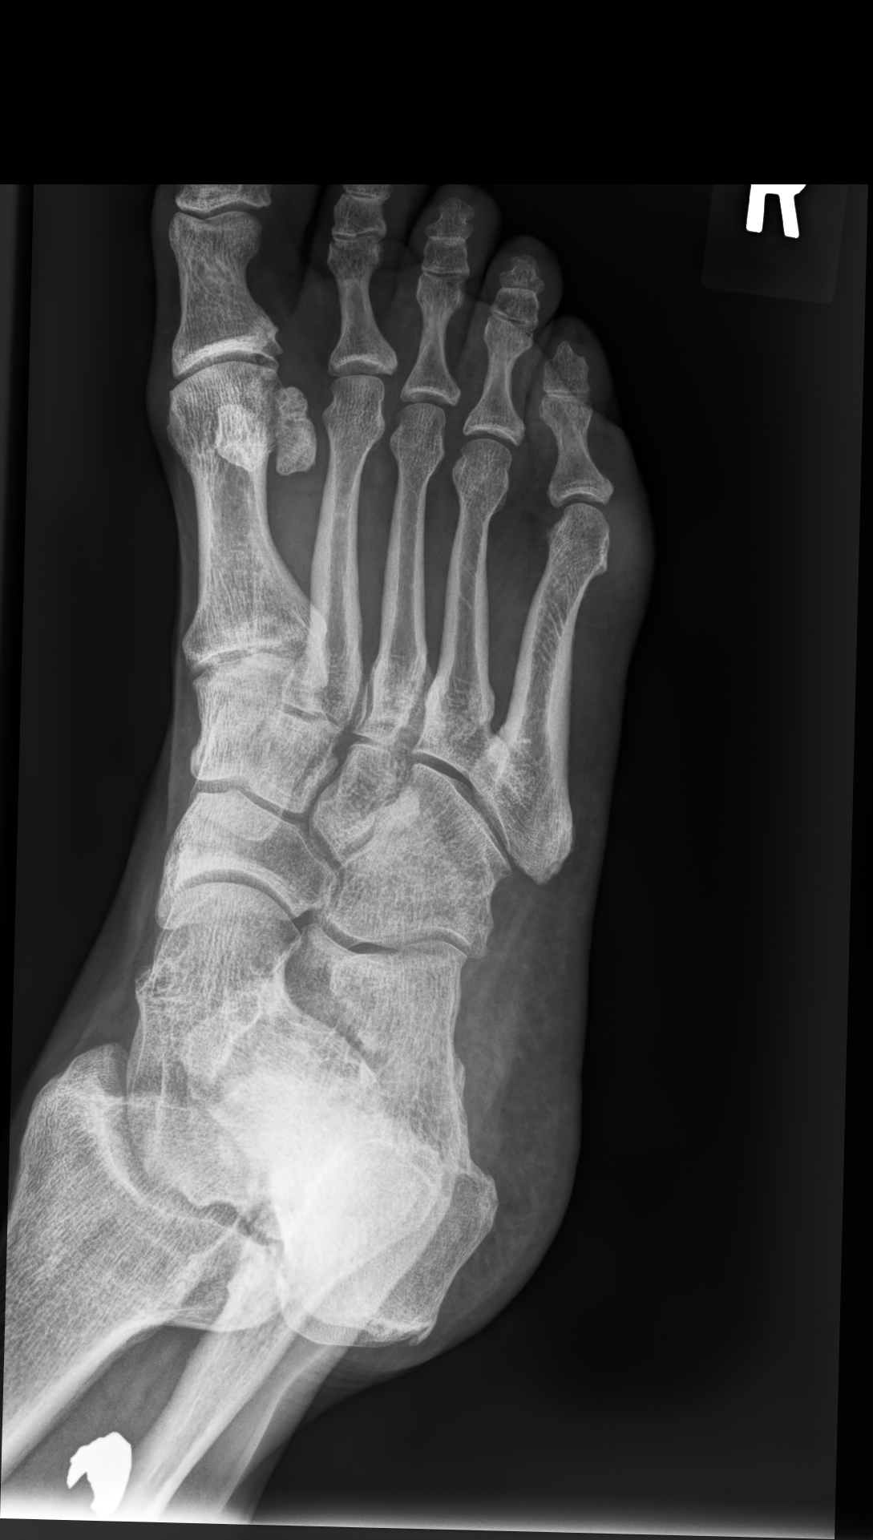

[foot lat]
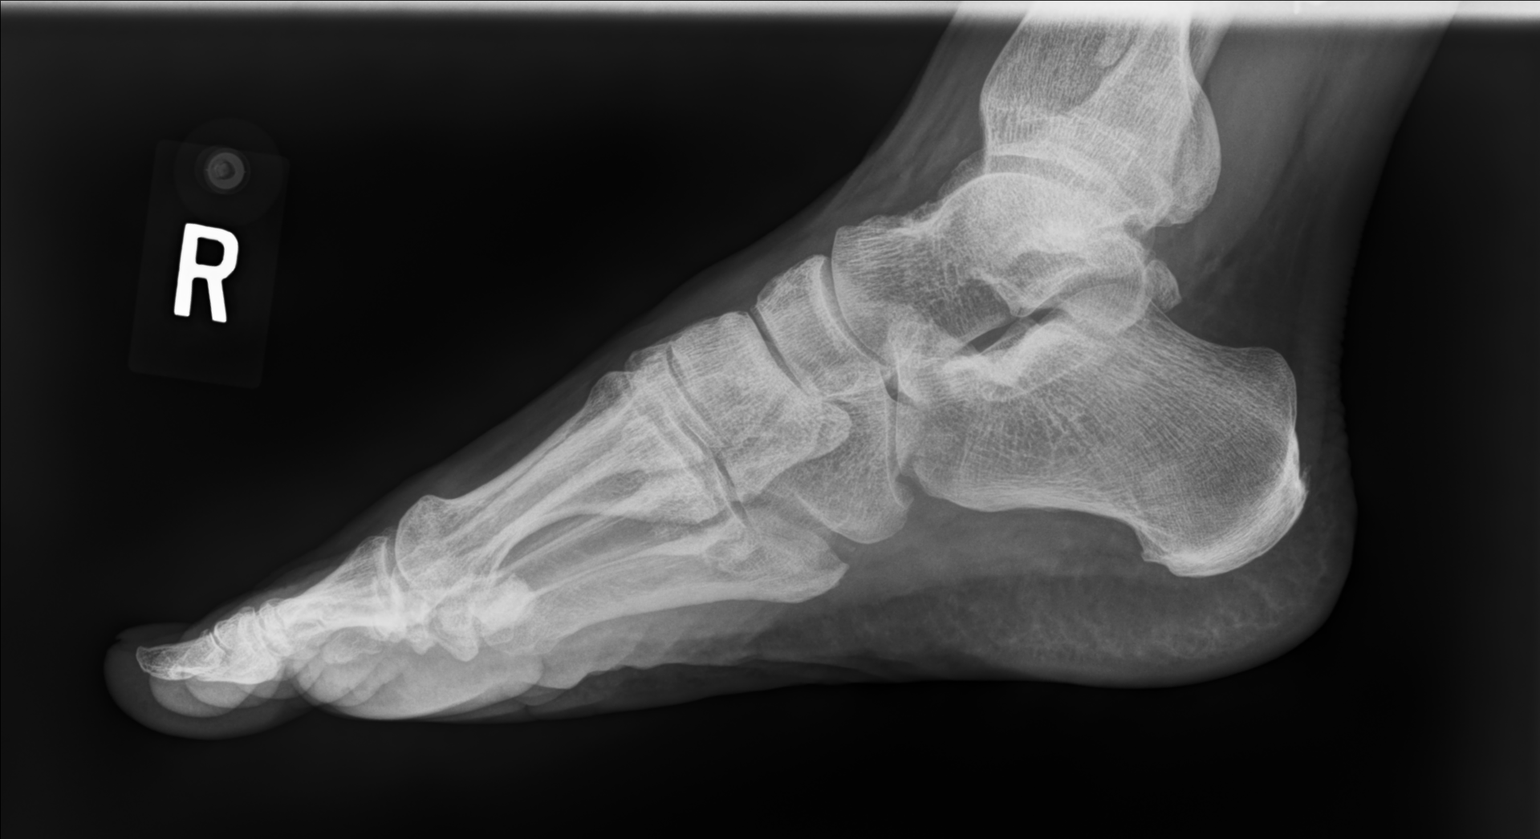

[3 of 3 positions shown; findings below may reference images not displayed]

FINDINGS: Diffuse degenerative change most prominent at the first MTP joint.
No acute bony abnormality identified. No evidence of fracture or
dislocation. Metallic density noted over the lower extremity.
IMPRESSION: Diffuse degenerative change, most prominent at the first MTP joint.
No acute abnormality otherwise noted.

## 2019-05-24 ENCOUNTER — Other Ambulatory Visit: Payer: Self-pay

## 2019-05-24 ENCOUNTER — Ambulatory Visit (INDEPENDENT_AMBULATORY_CARE_PROVIDER_SITE_OTHER): Payer: Medicare HMO | Admitting: Psychiatry

## 2019-05-24 DIAGNOSIS — F209 Schizophrenia, unspecified: Secondary | ICD-10-CM

## 2019-05-24 DIAGNOSIS — F333 Major depressive disorder, recurrent, severe with psychotic symptoms: Secondary | ICD-10-CM | POA: Diagnosis not present

## 2019-05-24 DIAGNOSIS — G4700 Insomnia, unspecified: Secondary | ICD-10-CM

## 2019-05-24 MED ORDER — HALOPERIDOL 2 MG PO TABS: 2.0000 mg | ORAL_TABLET | Freq: Every day | ORAL | 8 refills | Status: DC

## 2019-05-24 MED ORDER — BELSOMRA 20 MG PO TABS: 20.0000 mg | ORAL_TABLET | Freq: Every evening | ORAL | 4 refills | Status: DC

## 2019-05-24 MED ORDER — FLUOXETINE HCL 20 MG PO CAPS: ORAL_CAPSULE | ORAL | 1 refills | Status: DC

## 2019-05-24 NOTE — Progress Notes (Signed)
Patient ID: Gabriel Miller, male   DOB: 03-Jul-1960, 58 y.o.   MRN: [id number] [facility] MD Progress Note  12/21/2018 10:55 AM Gabriel Miller  MRN:  [id number] Subjective:  Principal Problem: Major depression with psychosis Diagnosis: Major depression with psychosis  Today the patient is doing well.  The patient has a chronic severe mental illness.  He has not been hospitalized in over 15 years but before that he was hospitalized multiple times.  This included a hospitalization at [facility] and [facility].  He says his initial hospitalizations occurred when he was in his 59s.  At that time he was not using any drugs or drinking any alcohol.  It is later hospitalizations he was using cocaine crack and drinking.  I suspect his mental illness began before the use of these substances.  Nonetheless he is completely clean and dry at this time.  He is not use any drugs or any alcohol.  The patient either lives with his son for 2 months or switches over to living with his brother for 2 months.  The patient is 5 grandchildren that he is involved with.  He has a daughter and a son.  Both of them are well.  The patient says physically he is doing fairly well.  He has a new primary care doctor that is recently seen.  The patient says he is sleeping and eating well.  He is got good energy.  He is very active.  He rides a golf cart on various trails.  The patient loves spending time with his sons animals.'s his son has dogs rabbits chickens etc.  The patient watches TV without problems.  He denies any psychosis at all.  His mood is very stable.  He has no symptoms of mania.  He takes his medicines as prescribed.  The patient is divorced and presently is in no interpersonal relationships.  The patient is happy with life.  It seems he has significant negative symptoms and that he has no particular drives or admissions.  He states that of life stays just as it is that will be good for him.    Past Medical History:  Past  Medical History:  Diagnosis Date  . Alcohol abuse   . Depression   . GERD (gastroesophageal reflux disease)   . Hypertension     Past Surgical History:  Procedure Laterality Date  . MIDDLE EAR SURGERY  1990   Family History:  Family History  Problem Relation Age of Onset  . Arthritis Mother   . Heart disease Mother   . Hypertension Mother   . Diabetes Mother   . COPD Mother   . Alcohol abuse Father   . Hypertension Father   . Diabetes Father   . Heart attack Father   . Hypertension Maternal Aunt   . Cancer Maternal Aunt        breast  . Cancer Maternal Grandmother        breast  . Diabetes Maternal Grandmother   . Diabetes Maternal Grandfather   . Diabetes Paternal Grandfather    Social History:  Social History   Substance and Sexual Activity  Alcohol Use No   Comment: drinks half-gallon liquor once a week.     Social History   Substance and Sexual Activity  Drug Use No    Social History   Socioeconomic History  . Marital status: Single    Spouse name: Not on file  . Number of children: Not on file  .  Years of education: Not on file  . Highest education level: Not on file  Occupational History  . Not on file  Social Needs  . Financial resource strain: Not on file  . Food insecurity    Worry: Not on file    Inability: Not on file  . Transportation needs    Medical: Not on file    Non-medical: Not on file  Tobacco Use  . Smoking status: Current Every Day Smoker    Packs/day: 1.00    Years: 20.00    Pack years: 20.00    Types: Cigarettes  . Smokeless tobacco: Never Used  . Tobacco comment: Has cut back to 1/2 pack and working on quiting   Substance and Sexual Activity  . Alcohol use: No    Comment: drinks half-gallon liquor once a week.  . Drug use: No  . Sexual activity: Not Currently  Lifestyle  . Physical activity    Days per week: Not on file    Minutes per session: Not on file  . Stress: Not on file  Relationships  . Social  Musician on phone: Not on file    Gets together: Not on file    Attends religious service: Not on file    Active member of club or organization: Not on file    Attends meetings of clubs or organizations: Not on file    Relationship status: Not on file  Other Topics Concern  . Not on file  Social History Narrative  . Not on file   Additional History:    Sleep: Good  Appetite:  Good   Assessment:   Musculoskeletal: Strength & Muscle Tone: within normal limits Gait & Station: normal Patient leans: Right   Psychiatric Specialty Exam: Physical Exam  ROS  There were no vitals taken for this visit.There is no height or weight on file to calculate BMI.  General Appearance: NA  Eye Contact::  Good  Speech:  Normal Rate  Volume:  Normal  Mood:  NA  Affect:  Appropriate  Thought Process:  Goal Directed  Orientation:  Full (Time, Place, and Person)  Thought Content:  WDL  Suicidal Thoughts:  No  Homicidal Thoughts:  No  Memory:  NA  Judgement:  Good  Insight:  Good  Psychomotor Activity:  Normal  Concentration:  Good  Recall:  Good  Fund of Knowledge:Good  Language: Poor  Akathisia:  No  Handed:  Right  AIMS (if indicated):     Assets:  Desire for Improvement  ADL's:  Intact  Cognition: WNL  Sleep:        Current Medications: Current Outpatient Medications  Medication Sig Dispense Refill  . albuterol (PROVENTIL) (5 MG/ML) 0.5% nebulizer solution Take 0.5 mLs (2.5 mg total) by nebulization every 2 (two) hours as needed for wheezing or shortness of breath. 20 mL 0  . FLUoxetine (PROZAC) 20 MG capsule 2 qam 180 capsule 1  . gabapentin (NEURONTIN) 400 MG capsule 1 Po qam and 3 po qhs 120 capsule 8  . haloperidol (HALDOL) 2 MG tablet Take 1 tablet (2 mg total) by mouth at bedtime. 30 tablet 8  . hydrochlorothiazide (HYDRODIURIL) 12.5 MG tablet TAKE 1 TABLET(12.5 MG) BY MOUTH DAILY 30 tablet 0  . HYDROcodone-acetaminophen (NORCO/VICODIN) 5-325 MG tablet  Take 1 tablet by mouth every 6 (six) hours as needed for moderate pain. (Patient not taking: Reported on 01/17/2018) 20 tablet 0  . losartan (COZAAR) 100 MG tablet TAKE 1 TABLET(100  MG) BY MOUTH DAILY 30 tablet 0  . losartan-hydrochlorothiazide (HYZAAR) 100-12.5 MG tablet TAKE 1 TABLET BY MOUTH DAILY 30 tablet 0  . losartan-hydrochlorothiazide (HYZAAR) 100-12.5 MG tablet Take 1 tablet by mouth daily. 30 tablet 0  . rosuvastatin (CRESTOR) 20 MG tablet Take 1 tablet (20 mg total) by mouth daily. (Patient not taking: Reported on 10/24/2017) 90 tablet 1  . sildenafil (VIAGRA) 100 MG tablet Take 0.5-1 tablets (50-100 mg total) by mouth daily as needed for erectile dysfunction. (Patient not taking: Reported on 01/17/2018) 5 tablet 11  . Suvorexant (BELSOMRA) 20 MG TABS Take 20 mg by mouth Nightly. 30 tablet 4  . tiotropium (SPIRIVA) 18 MCG inhalation capsule Place 1 capsule (18 mcg total) into inhaler and inhale daily. 30 capsule 5   No current facility-administered medications for this visit.     Lab Results: No results found for this or any previous visit (from the past 48 hour(s)).  Physical Findings: AIMS:  , ,  ,  ,    CIWA:    COWS:     Treatment Plan Summary:   This patient's first problem is that of schizophrenia.  This is based upon a distant history of multiple psychiatric hospitalizations early in his life where he was demonstrating auditory hallucinations and other psychotic symptomatology.  Decades later he was hospitalized multiple times complicated by the use of substances like crack cocaine.  At this time he is completely clean.  At this time he will continue taking Haldol 2 mg.  He has no evidence of tardive dyskinesia on recent exams.  When his next visit with me will be sure to do an aims scale.  His second problem is that of major depression.  The patient takes Prozac 40 mg and does well.  His third problem is that of insomnia.  The patient takes Belsomra which works very well for  him.  At this time this patient is very stable.  He lives either with his son or with his brother.  Both of them give him a stable environment.  He will return to see me in 5 months.

## 2019-09-17 ENCOUNTER — Telehealth (HOSPITAL_COMMUNITY): Payer: Self-pay

## 2019-09-17 NOTE — Telephone Encounter (Signed)
Patient called regarding his Gabapentin 400mg . I spoke with the pharmacist and they stated that they need a new script sent in for his Gabapentin to be put on file so they can fill it on 7/8. He wants it sent to [facility] on [phone] in [place]. Thank you.

## 2019-09-18 ENCOUNTER — Other Ambulatory Visit (HOSPITAL_COMMUNITY): Payer: Self-pay

## 2019-09-18 DIAGNOSIS — F323 Major depressive disorder, single episode, severe with psychotic features: Secondary | ICD-10-CM

## 2019-09-18 MED ORDER — GABAPENTIN 400 MG PO CAPS: ORAL_CAPSULE | ORAL | 8 refills | Status: DC

## 2019-09-18 NOTE — Telephone Encounter (Signed)
Done

## 2019-10-30 ENCOUNTER — Telehealth (INDEPENDENT_AMBULATORY_CARE_PROVIDER_SITE_OTHER): Payer: Medicare HMO | Admitting: Psychiatry

## 2019-10-30 ENCOUNTER — Other Ambulatory Visit: Payer: Self-pay

## 2019-10-30 DIAGNOSIS — F200 Paranoid schizophrenia: Secondary | ICD-10-CM | POA: Diagnosis not present

## 2019-10-30 MED ORDER — BELSOMRA 20 MG PO TABS: 20.0000 mg | ORAL_TABLET | Freq: Every evening | ORAL | 4 refills | Status: DC

## 2019-10-30 MED ORDER — HALOPERIDOL 2 MG PO TABS: 2.0000 mg | ORAL_TABLET | Freq: Every day | ORAL | 8 refills | Status: DC

## 2019-10-30 MED ORDER — FLUOXETINE HCL 20 MG PO CAPS: ORAL_CAPSULE | ORAL | 1 refills | Status: DC

## 2019-10-30 NOTE — Progress Notes (Signed)
Patient ID: Gabriel Miller, male   DOB: 03-20-1961, 59 y.o.   MRN: [id number] [facility] MD Progress Note  12/21/2018 10:55 AM Gabriel Miller  MRN:  [id number] Subjective:  Principal Problem: Major depression with psychosis Diagnosis: Major depression with psychosis  Today the patient is doing well.  He is positive and optimistic.  He lives with his family he has chickens and dogs and is happy.  He takes his medicines as prescribed.  He is sleeping and eating well.  He denies hallucinations at this time.  Using no drugs.  He spends a lot of time with his 5 grandchildren.  The patient is focused and organized.  Today we had a discussion of the importance of the vaccine.  Is not getting it and he wanted my opinion.  I shared with him that I thought was a good idea to get it there will be talking to his primary care doctor for routine visit in 1 week.  This patient has not been seen in over 9 months.  Will have an appointment for him to come into the office in 4 months and at that time we will do an aims scale.  His psychotic symptoms did seem to be on hold.  He denies being depressed.  He is enjoying life.  He is not suicidal.  Past Medical History:  Past Medical History:  Diagnosis Date  . Alcohol abuse   . Depression   . GERD (gastroesophageal reflux disease)   . Hypertension     Past Surgical History:  Procedure Laterality Date  . MIDDLE EAR SURGERY  1990   Family History:  Family History  Problem Relation Age of Onset  . Arthritis Mother   . Heart disease Mother   . Hypertension Mother   . Diabetes Mother   . COPD Mother   . Alcohol abuse Father   . Hypertension Father   . Diabetes Father   . Heart attack Father   . Hypertension Maternal Aunt   . Cancer Maternal Aunt        breast  . Cancer Maternal Grandmother        breast  . Diabetes Maternal Grandmother   . Diabetes Maternal Grandfather   . Diabetes Paternal Grandfather    Social History:  Social History   Substance and  Sexual Activity  Alcohol Use No   Comment: drinks half-gallon liquor once a week.     Social History   Substance and Sexual Activity  Drug Use No    Social History   Socioeconomic History  . Marital status: Single    Spouse name: Not on file  . Number of children: Not on file  . Years of education: Not on file  . Highest education level: Not on file  Occupational History  . Not on file  Social Needs  . Financial resource strain: Not on file  . Food insecurity    Worry: Not on file    Inability: Not on file  . Transportation needs    Medical: Not on file    Non-medical: Not on file  Tobacco Use  . Smoking status: Current Every Day Smoker    Packs/day: 1.00    Years: 20.00    Pack years: 20.00    Types: Cigarettes  . Smokeless tobacco: Never Used  . Tobacco comment: Has cut back to 1/2 pack and working on quiting   Substance and Sexual Activity  . Alcohol use: No    Comment: drinks half-gallon liquor  once a week.  . Drug use: No  . Sexual activity: Not Currently  Lifestyle  . Physical activity    Days per week: Not on file    Minutes per session: Not on file  . Stress: Not on file  Relationships  . Social Musician on phone: Not on file    Gets together: Not on file    Attends religious service: Not on file    Active member of club or organization: Not on file    Attends meetings of clubs or organizations: Not on file    Relationship status: Not on file  Other Topics Concern  . Not on file  Social History Narrative  . Not on file   Additional History:    Sleep: Good  Appetite:  Good   Assessment:   Musculoskeletal: Strength & Muscle Tone: within normal limits Gait & Station: normal Patient leans: Right   Psychiatric Specialty Exam: Physical Exam  ROS  There were no vitals taken for this visit.There is no height or weight on file to calculate BMI.  General Appearance: NA  Eye Contact::  Good  Speech:  Normal Rate  Volume:  Normal   Mood:  NA  Affect:  Appropriate  Thought Process:  Goal Directed  Orientation:  Full (Time, Place, and Person)  Thought Content:  WDL  Suicidal Thoughts:  No  Homicidal Thoughts:  No  Memory:  NA  Judgement:  Good  Insight:  Good  Psychomotor Activity:  Normal  Concentration:  Good  Recall:  Good  Fund of Knowledge:Good  Language: Poor  Akathisia:  No  Handed:  Right  AIMS (if indicated):     Assets:  Desire for Improvement  ADL's:  Intact  Cognition: WNL  Sleep:        Current Medications: Current Outpatient Medications  Medication Sig Dispense Refill  . albuterol (PROVENTIL) (5 MG/ML) 0.5% nebulizer solution Take 0.5 mLs (2.5 mg total) by nebulization every 2 (two) hours as needed for wheezing or shortness of breath. 20 mL 0  . FLUoxetine (PROZAC) 20 MG capsule 2 qam 180 capsule 1  . gabapentin (NEURONTIN) 400 MG capsule 1 Po qam and 3 po qhs 120 capsule 8  . haloperidol (HALDOL) 2 MG tablet Take 1 tablet (2 mg total) by mouth at bedtime. 30 tablet 8  . hydrochlorothiazide (HYDRODIURIL) 12.5 MG tablet TAKE 1 TABLET(12.5 MG) BY MOUTH DAILY 30 tablet 0  . HYDROcodone-acetaminophen (NORCO/VICODIN) 5-325 MG tablet Take 1 tablet by mouth every 6 (six) hours as needed for moderate pain. (Patient not taking: Reported on 01/17/2018) 20 tablet 0  . losartan (COZAAR) 100 MG tablet TAKE 1 TABLET(100 MG) BY MOUTH DAILY 30 tablet 0  . losartan-hydrochlorothiazide (HYZAAR) 100-12.5 MG tablet TAKE 1 TABLET BY MOUTH DAILY 30 tablet 0  . losartan-hydrochlorothiazide (HYZAAR) 100-12.5 MG tablet Take 1 tablet by mouth daily. 30 tablet 0  . rosuvastatin (CRESTOR) 20 MG tablet Take 1 tablet (20 mg total) by mouth daily. (Patient not taking: Reported on 10/24/2017) 90 tablet 1  . sildenafil (VIAGRA) 100 MG tablet Take 0.5-1 tablets (50-100 mg total) by mouth daily as needed for erectile dysfunction. (Patient not taking: Reported on 01/17/2018) 5 tablet 11  . Suvorexant (BELSOMRA) 20 MG TABS Take  20 mg by mouth Nightly. 30 tablet 4  . tiotropium (SPIRIVA) 18 MCG inhalation capsule Place 1 capsule (18 mcg total) into inhaler and inhale daily. 30 capsule 5   No current facility-administered medications for  this visit.     Lab Results: No results found for this or any previous visit (from the past 48 hour(s)).  Physical Findings: AIMS:  , ,  ,  ,    CIWA:    COWS:     Treatment Plan Summary:   This patient has 3 problems.  First 1 is that of major depression.  He takes 40 mg of Prozac and does well.  He denies being depressed or having any vegetative symptoms.  His second problem is that of schizophrenia.  He will continue taking 2 mg of Haldol.  When his next visit we will make an effort to get blood work from him and also do an aims scale.  His third problem is that of insomnia.  He will continue taking Belsomra as prescribed.  He sleeps well.  The patient overall is functioning well has good energy and is very compliant with his care.

## 2020-02-26 ENCOUNTER — Other Ambulatory Visit: Payer: Self-pay

## 2020-02-26 ENCOUNTER — Telehealth (INDEPENDENT_AMBULATORY_CARE_PROVIDER_SITE_OTHER): Payer: Medicare HMO | Admitting: Psychiatry

## 2020-02-26 DIAGNOSIS — F325 Major depressive disorder, single episode, in full remission: Secondary | ICD-10-CM | POA: Diagnosis not present

## 2020-02-26 MED ORDER — BELSOMRA 20 MG PO TABS: 20.0000 mg | ORAL_TABLET | Freq: Every evening | ORAL | 4 refills | Status: DC

## 2020-02-26 MED ORDER — FLUOXETINE HCL 20 MG PO CAPS
ORAL_CAPSULE | ORAL | 1 refills | Status: DC
Start: 2020-02-26 — End: 2020-05-19

## 2020-02-26 MED ORDER — HALOPERIDOL 2 MG PO TABS
2.0000 mg | ORAL_TABLET | Freq: Every day | ORAL | 8 refills | Status: DC
Start: 2020-02-26 — End: 2020-05-19

## 2020-02-26 NOTE — Progress Notes (Signed)
Patient ID: Gabriel Miller, male   DOB: 05/09/1960, 59 y.o.   MRN: [id number] [facility] MD Progress Note  12/21/2018 10:55 AM Gabriel Miller  MRN:  [id number] Subjective:  Principal Problem: Major depression with psychosis Diagnosis: Major depression with psychosis  Today patient is doing well.  He is positive and optimistic.  He lives by himself where he goes and spends time with his son who has about 3 or 4 children.  Patient seems happy.  He denies being depressed or anxious.  He shows no evidence of psychosis.  Is not hearing voices or seeing visions and not paranoid.  [place] area with his dogs and his chickens.  He is sleeping and eating well.  He is got good energy.  He is actually functioning fairly well.  He is very ambivalent again about getting his vaccines.  Apparently his siblings got the vaccine and got sick for a few weeks.  But today patient shows me that he is made a decision to get his vaccine.  I supported him to do that.  The patient denies any chest pain shortness of breath or any neurological symptoms.  Generally he feels pretty well.  He is very compliant with his medicines.  Past Medical History:  Past Medical History:  Diagnosis Date  . Alcohol abuse   . Depression   . GERD (gastroesophageal reflux disease)   . Hypertension     Past Surgical History:  Procedure Laterality Date  . MIDDLE EAR SURGERY  1990   Family History:  Family History  Problem Relation Age of Onset  . Arthritis Mother   . Heart disease Mother   . Hypertension Mother   . Diabetes Mother   . COPD Mother   . Alcohol abuse Father   . Hypertension Father   . Diabetes Father   . Heart attack Father   . Hypertension Maternal Aunt   . Cancer Maternal Aunt        breast  . Cancer Maternal Grandmother        breast  . Diabetes Maternal Grandmother   . Diabetes Maternal Grandfather   . Diabetes Paternal Grandfather    Social History:  Social History   Substance and Sexual  Activity  Alcohol Use No   Comment: drinks half-gallon liquor once a week.     Social History   Substance and Sexual Activity  Drug Use No    Social History   Socioeconomic History  . Marital status: Single    Spouse name: Not on file  . Number of children: Not on file  . Years of education: Not on file  . Highest education level: Not on file  Occupational History  . Not on file  Social Needs  . Financial resource strain: Not on file  . Food insecurity    Worry: Not on file    Inability: Not on file  . Transportation needs    Medical: Not on file    Non-medical: Not on file  Tobacco Use  . Smoking status: Current Every Day Smoker    Packs/day: 1.00    Years: 20.00    Pack years: 20.00    Types: Cigarettes  . Smokeless tobacco: Never Used  . Tobacco comment: Has cut back to 1/2 pack and working on quiting   Substance and Sexual Activity  . Alcohol use: No    Comment: drinks half-gallon liquor once a week.  . Drug use: No  . Sexual activity:  Not Currently  Lifestyle  . Physical activity    Days per week: Not on file    Minutes per session: Not on file  . Stress: Not on file  Relationships  . Social Musician on phone: Not on file    Gets together: Not on file    Attends religious service: Not on file    Active member of club or organization: Not on file    Attends meetings of clubs or organizations: Not on file    Relationship status: Not on file  Other Topics Concern  . Not on file  Social History Narrative  . Not on file   Additional History:    Sleep: Good  Appetite:  Good   Assessment:   Musculoskeletal: Strength & Muscle Tone: within normal limits Gait & Station: normal Patient leans: Right   Psychiatric Specialty Exam: Physical Exam  ROS  There were no vitals taken for this visit.There is no height or weight on file to calculate BMI.  General Appearance: NA  Eye Contact::  Good  Speech:  Normal Rate  Volume:  Normal   Mood:  NA  Affect:  Appropriate  Thought Process:  Goal Directed  Orientation:  Full (Time, Place, and Person)  Thought Content:  WDL  Suicidal Thoughts:  No  Homicidal Thoughts:  No  Memory:  NA  Judgement:  Good  Insight:  Good  Psychomotor Activity:  Normal  Concentration:  Good  Recall:  Good  Fund of Knowledge:Good  Language: Poor  Akathisia:  No  Handed:  Right  AIMS (if indicated):     Assets:  Desire for Improvement  ADL's:  Intact  Cognition: WNL  Sleep:        Current Medications: Current Outpatient Medications  Medication Sig Dispense Refill  . albuterol (PROVENTIL) (5 MG/ML) 0.5% nebulizer solution Take 0.5 mLs (2.5 mg total) by nebulization every 2 (two) hours as needed for wheezing or shortness of breath. 20 mL 0  . FLUoxetine (PROZAC) 20 MG capsule 2 qam 180 capsule 1  . gabapentin (NEURONTIN) 400 MG capsule 1 Po qam and 3 po qhs 120 capsule 8  . haloperidol (HALDOL) 2 MG tablet Take 1 tablet (2 mg total) by mouth at bedtime. 30 tablet 8  . hydrochlorothiazide (HYDRODIURIL) 12.5 MG tablet TAKE 1 TABLET(12.5 MG) BY MOUTH DAILY 30 tablet 0  . HYDROcodone-acetaminophen (NORCO/VICODIN) 5-325 MG tablet Take 1 tablet by mouth every 6 (six) hours as needed for moderate pain. (Patient not taking: Reported on 01/17/2018) 20 tablet 0  . losartan (COZAAR) 100 MG tablet TAKE 1 TABLET(100 MG) BY MOUTH DAILY 30 tablet 0  . losartan-hydrochlorothiazide (HYZAAR) 100-12.5 MG tablet TAKE 1 TABLET BY MOUTH DAILY 30 tablet 0  . losartan-hydrochlorothiazide (HYZAAR) 100-12.5 MG tablet Take 1 tablet by mouth daily. 30 tablet 0  . rosuvastatin (CRESTOR) 20 MG tablet Take 1 tablet (20 mg total) by mouth daily. (Patient not taking: Reported on 10/24/2017) 90 tablet 1  . sildenafil (VIAGRA) 100 MG tablet Take 0.5-1 tablets (50-100 mg total) by mouth daily as needed for erectile dysfunction. (Patient not taking: Reported on 01/17/2018) 5 tablet 11  . Suvorexant (BELSOMRA) 20 MG TABS Take  20 mg by mouth Nightly. 30 tablet 4  . tiotropium (SPIRIVA) 18 MCG inhalation capsule Place 1 capsule (18 mcg total) into inhaler and inhale daily. 30 capsule 5   No current facility-administered medications for this visit.     Lab Results: No results found for  this or any previous visit (from the past 48 hour(s)).  Physical Findings: AIMS:  , ,  ,  ,    CIWA:    COWS:     Treatment Plan Summary:   This patient has 3 problems.  His first is that of major depression.  He has no vegetative symptoms and his mood is great.  He will continue taking his Prozac as prescribed.  His second diagnosis is that of schizophrenia.  He takes Haldol 2 mg and seems to do well.  He will need to be seen in the next few months to get an aims scale.  His psychosis is not present at this time.  His third problem is insomnia.  The patient takes 20 mg of Belsomra and does very well.  I think he is functioning extremely well.  He is got a good family support.  We will come back to see me in 3 months.

## 2020-05-05 ENCOUNTER — Ambulatory Visit: Payer: Medicare HMO

## 2020-05-19 ENCOUNTER — Telehealth (INDEPENDENT_AMBULATORY_CARE_PROVIDER_SITE_OTHER): Payer: Medicare HMO | Admitting: Psychiatry

## 2020-05-19 ENCOUNTER — Other Ambulatory Visit: Payer: Self-pay

## 2020-05-19 DIAGNOSIS — F325 Major depressive disorder, single episode, in full remission: Secondary | ICD-10-CM | POA: Diagnosis not present

## 2020-05-19 MED ORDER — BELSOMRA 20 MG PO TABS: 20.0000 mg | ORAL_TABLET | Freq: Every evening | ORAL | 4 refills | Status: AC

## 2020-05-19 MED ORDER — FLUOXETINE HCL 20 MG PO CAPS: ORAL_CAPSULE | ORAL | 1 refills | Status: AC

## 2020-05-19 MED ORDER — HALOPERIDOL 2 MG PO TABS: 2.0000 mg | ORAL_TABLET | Freq: Every day | ORAL | 8 refills | Status: AC

## 2020-05-19 NOTE — Progress Notes (Signed)
Patient ID: Gabriel Miller, male   DOB: 10-Feb-1961, 60 y.o.   MRN: [id number] [facility] MD Progress Note  12/21/2018 10:55 AM Gabriel Miller  MRN:  [id number] Subjective:  Principal Problem: Major depression with psychosis Diagnosis: Major depression with psychosis  Today the patient is at his baseline.  He is stable.  He lives at his own home but he visits his son a lot.  He actually helps babysit their 4 children aged aging from age 38 to about 60 years old.  The patient is sleeping and eating well.  He is got good energy.  He has no evidence of psychosis.  He takes his medicines just as prescribed.  He is unable to describe anything that resembles tardive dyskinesia.  When his next visit we will do an aims scale.  His mood is good and he stays active.  His thoughts are clear organized and connected.  He enjoys being with his grandchildren.  This patient will be seen again in 5 months.  He is very stable.  He is followed closely by his primary care doctor and denies any chest pain shortness of breath or any neurological symptoms. Past Medical History:  Past Medical History:  Diagnosis Date  . Alcohol abuse   . Depression   . GERD (gastroesophageal reflux disease)   . Hypertension     Past Surgical History:  Procedure Laterality Date  . MIDDLE EAR SURGERY  1990   Family History:  Family History  Problem Relation Age of Onset  . Arthritis Mother   . Heart disease Mother   . Hypertension Mother   . Diabetes Mother   . COPD Mother   . Alcohol abuse Father   . Hypertension Father   . Diabetes Father   . Heart attack Father   . Hypertension Maternal Aunt   . Cancer Maternal Aunt        breast  . Cancer Maternal Grandmother        breast  . Diabetes Maternal Grandmother   . Diabetes Maternal Grandfather   . Diabetes Paternal Grandfather    Social History:  Social History   Substance and Sexual Activity  Alcohol Use No   Comment: drinks half-gallon liquor once a week.     Social  History   Substance and Sexual Activity  Drug Use No    Social History   Socioeconomic History  . Marital status: Single    Spouse name: Not on file  . Number of children: Not on file  . Years of education: Not on file  . Highest education level: Not on file  Occupational History  . Not on file  Social Needs  . Financial resource strain: Not on file  . Food insecurity    Worry: Not on file    Inability: Not on file  . Transportation needs    Medical: Not on file    Non-medical: Not on file  Tobacco Use  . Smoking status: Current Every Day Smoker    Packs/day: 1.00    Years: 20.00    Pack years: 20.00    Types: Cigarettes  . Smokeless tobacco: Never Used  . Tobacco comment: Has cut back to 1/2 pack and working on quiting   Substance and Sexual Activity  . Alcohol use: No    Comment: drinks half-gallon liquor once a week.  . Drug use: No  . Sexual activity: Not Currently  Lifestyle  . Physical activity    Days per week: Not on  file    Minutes per session: Not on file  . Stress: Not on file  Relationships  . Social Musician on phone: Not on file    Gets together: Not on file    Attends religious service: Not on file    Active member of club or organization: Not on file    Attends meetings of clubs or organizations: Not on file    Relationship status: Not on file  Other Topics Concern  . Not on file  Social History Narrative  . Not on file   Additional History:    Sleep: Good  Appetite:  Good   Assessment:   Musculoskeletal: Strength & Muscle Tone: within normal limits Gait & Station: normal Patient leans: Right   Psychiatric Specialty Exam: Physical Exam  ROS  There were no vitals taken for this visit.There is no height or weight on file to calculate BMI.  General Appearance: NA  Eye Contact::  Good  Speech:  Normal Rate  Volume:  Normal  Mood:  NA  Affect:  Appropriate  Thought Process:  Goal Directed  Orientation:  Full (Time,  Place, and Person)  Thought Content:  WDL  Suicidal Thoughts:  No  Homicidal Thoughts:  No  Memory:  NA  Judgement:  Good  Insight:  Good  Psychomotor Activity:  Normal  Concentration:  Good  Recall:  Good  Fund of Knowledge:Good  Language: Poor  Akathisia:  No  Handed:  Right  AIMS (if indicated):     Assets:  Desire for Improvement  ADL's:  Intact  Cognition: WNL  Sleep:        Current Medications: Current Outpatient Medications  Medication Sig Dispense Refill  . albuterol (PROVENTIL) (5 MG/ML) 0.5% nebulizer solution Take 0.5 mLs (2.5 mg total) by nebulization every 2 (two) hours as needed for wheezing or shortness of breath. 20 mL 0  . FLUoxetine (PROZAC) 20 MG capsule 2 qam 180 capsule 1  . gabapentin (NEURONTIN) 400 MG capsule 1 Po qam and 3 po qhs 120 capsule 8  . haloperidol (HALDOL) 2 MG tablet Take 1 tablet (2 mg total) by mouth at bedtime. 30 tablet 8  . hydrochlorothiazide (HYDRODIURIL) 12.5 MG tablet TAKE 1 TABLET(12.5 MG) BY MOUTH DAILY 30 tablet 0  . HYDROcodone-acetaminophen (NORCO/VICODIN) 5-325 MG tablet Take 1 tablet by mouth every 6 (six) hours as needed for moderate pain. (Patient not taking: Reported on 01/17/2018) 20 tablet 0  . losartan (COZAAR) 100 MG tablet TAKE 1 TABLET(100 MG) BY MOUTH DAILY 30 tablet 0  . losartan-hydrochlorothiazide (HYZAAR) 100-12.5 MG tablet TAKE 1 TABLET BY MOUTH DAILY 30 tablet 0  . losartan-hydrochlorothiazide (HYZAAR) 100-12.5 MG tablet Take 1 tablet by mouth daily. 30 tablet 0  . rosuvastatin (CRESTOR) 20 MG tablet Take 1 tablet (20 mg total) by mouth daily. (Patient not taking: Reported on 10/24/2017) 90 tablet 1  . sildenafil (VIAGRA) 100 MG tablet Take 0.5-1 tablets (50-100 mg total) by mouth daily as needed for erectile dysfunction. (Patient not taking: Reported on 01/17/2018) 5 tablet 11  . Suvorexant (BELSOMRA) 20 MG TABS Take 20 mg by mouth Nightly. 30 tablet 4  . tiotropium (SPIRIVA) 18 MCG inhalation capsule Place 1  capsule (18 mcg total) into inhaler and inhale daily. 30 capsule 5   No current facility-administered medications for this visit.     Lab Results: No results found for this or any previous visit (from the past 48 hour(s)).  Physical Findings: AIMS:  , ,  ,  ,  CIWA:    COWS:     Treatment Plan Summary:   The patient has 3 problems.  His first is major depression.  He takes Prozac that is been taking for years.  He is doing very well.  His second problem is that of schizophrenia.  He will continue taking low-dose Haldol for this condition.  His third problem is that of insomnia.  He takes 20 mg of Belsomra and does very well.  He will return to see me in a few months and at that time we will do an aims scale.  He seems to be very stable.

## 2020-06-22 ENCOUNTER — Other Ambulatory Visit (HOSPITAL_COMMUNITY): Payer: Self-pay | Admitting: Psychiatry

## 2020-06-22 DIAGNOSIS — F323 Major depressive disorder, single episode, severe with psychotic features: Secondary | ICD-10-CM

## 2020-07-15 ENCOUNTER — Other Ambulatory Visit (HOSPITAL_COMMUNITY): Payer: Self-pay | Admitting: Psychiatry

## 2020-07-15 DIAGNOSIS — F323 Major depressive disorder, single episode, severe with psychotic features: Secondary | ICD-10-CM

## 2020-07-29 ENCOUNTER — Other Ambulatory Visit (HOSPITAL_COMMUNITY): Payer: Self-pay | Admitting: Psychiatry

## 2020-07-29 DIAGNOSIS — F323 Major depressive disorder, single episode, severe with psychotic features: Secondary | ICD-10-CM

## 2020-08-11 ENCOUNTER — Other Ambulatory Visit (HOSPITAL_COMMUNITY): Payer: Self-pay | Admitting: *Deleted

## 2020-08-11 DIAGNOSIS — F323 Major depressive disorder, single episode, severe with psychotic features: Secondary | ICD-10-CM

## 2020-08-11 MED ORDER — GABAPENTIN 400 MG PO CAPS: ORAL_CAPSULE | ORAL | 1 refills | Status: AC

## 2020-09-18 DEATH — deceased

## 2020-09-22 ENCOUNTER — Other Ambulatory Visit: Payer: Self-pay

## 2020-09-22 ENCOUNTER — Telehealth (HOSPITAL_COMMUNITY): Payer: Medicare HMO | Admitting: Psychiatry
